# Patient Record
Sex: Female | Born: 1945
Health system: Southern US, Community
[De-identification: ages and names within clinical notes are randomized; demographics above are authoritative.]

## PROBLEM LIST (undated history)

## (undated) DIAGNOSIS — N6009 Solitary cyst of unspecified breast: Secondary | ICD-10-CM

## (undated) DIAGNOSIS — C50219 Malignant neoplasm of upper-inner quadrant of unspecified female breast: Secondary | ICD-10-CM

## (undated) DIAGNOSIS — T7840XA Allergy, unspecified, initial encounter: Secondary | ICD-10-CM

## (undated) DIAGNOSIS — E669 Obesity, unspecified: Secondary | ICD-10-CM

## (undated) DIAGNOSIS — Z1211 Encounter for screening for malignant neoplasm of colon: Secondary | ICD-10-CM

## (undated) DIAGNOSIS — C801 Malignant (primary) neoplasm, unspecified: Secondary | ICD-10-CM

## (undated) HISTORY — DX: Obesity, unspecified: E66.9

## (undated) HISTORY — DX: Encounter for screening for malignant neoplasm of colon: Z12.11

## (undated) HISTORY — DX: Malignant neoplasm of upper-inner quadrant of unspecified female breast: C50.219

## (undated) HISTORY — PX: APPENDECTOMY: SHX54

## (undated) HISTORY — PX: TONSILLECTOMY: SUR1361

## (undated) HISTORY — PX: TUBAL LIGATION: SHX77

## (undated) HISTORY — DX: Allergy, unspecified, initial encounter: T78.40XA

## (undated) HISTORY — DX: Malignant (primary) neoplasm, unspecified: C80.1

## (undated) HISTORY — DX: Solitary cyst of unspecified breast: N60.09

---

## 1989-01-05 HISTORY — PX: NASAL SINUS SURGERY: SHX719

## 1994-01-05 HISTORY — PX: FOOT SURGERY: SHX648

## 1997-04-02 ENCOUNTER — Other Ambulatory Visit: Admission: RE | Admit: 1997-04-02 | Discharge: 1997-04-02 | Payer: Self-pay | Admitting: Obstetrics & Gynecology

## 1998-05-20 ENCOUNTER — Other Ambulatory Visit: Admission: RE | Admit: 1998-05-20 | Discharge: 1998-05-20 | Payer: Self-pay | Admitting: Obstetrics & Gynecology

## 1999-12-01 ENCOUNTER — Other Ambulatory Visit: Admission: RE | Admit: 1999-12-01 | Discharge: 1999-12-01 | Payer: Self-pay | Admitting: Obstetrics & Gynecology

## 1999-12-02 ENCOUNTER — Other Ambulatory Visit: Admission: RE | Admit: 1999-12-02 | Discharge: 1999-12-02 | Payer: Self-pay | Admitting: Obstetrics & Gynecology

## 2001-02-09 ENCOUNTER — Other Ambulatory Visit: Admission: RE | Admit: 2001-02-09 | Discharge: 2001-02-09 | Payer: Self-pay | Admitting: Obstetrics & Gynecology

## 2004-01-06 HISTORY — PX: BREAST CYST EXCISION: SHX579

## 2008-01-06 DIAGNOSIS — C801 Malignant (primary) neoplasm, unspecified: Secondary | ICD-10-CM

## 2008-01-06 DIAGNOSIS — C50219 Malignant neoplasm of upper-inner quadrant of unspecified female breast: Secondary | ICD-10-CM

## 2008-01-06 DIAGNOSIS — T7840XA Allergy, unspecified, initial encounter: Secondary | ICD-10-CM

## 2008-01-06 HISTORY — DX: Malignant neoplasm of upper-inner quadrant of unspecified female breast: C50.219

## 2008-01-06 HISTORY — PX: BREAST BIOPSY: SHX20

## 2008-01-06 HISTORY — PX: UPPER GI ENDOSCOPY: SHX6162

## 2008-01-06 HISTORY — DX: Malignant (primary) neoplasm, unspecified: C80.1

## 2008-01-06 HISTORY — PX: COLONOSCOPY: SHX174

## 2008-01-06 HISTORY — DX: Allergy, unspecified, initial encounter: T78.40XA

## 2008-01-06 HISTORY — PX: BREAST SURGERY: SHX581

## 2008-10-16 ENCOUNTER — Ambulatory Visit: Payer: Self-pay | Admitting: General Surgery

## 2008-10-23 ENCOUNTER — Encounter: Admission: RE | Admit: 2008-10-23 | Discharge: 2008-10-23 | Payer: Self-pay | Admitting: Surgery

## 2008-11-21 ENCOUNTER — Ambulatory Visit: Payer: Self-pay | Admitting: General Surgery

## 2008-11-22 ENCOUNTER — Inpatient Hospital Stay: Payer: Self-pay | Admitting: General Surgery

## 2009-09-20 ENCOUNTER — Ambulatory Visit: Payer: Self-pay | Admitting: Otolaryngology

## 2010-01-05 DIAGNOSIS — N6009 Solitary cyst of unspecified breast: Secondary | ICD-10-CM

## 2010-01-05 HISTORY — PX: KNEE ARTHROSCOPY: SUR90

## 2010-01-05 HISTORY — DX: Solitary cyst of unspecified breast: N60.09

## 2010-01-07 ENCOUNTER — Encounter
Admission: RE | Admit: 2010-01-07 | Discharge: 2010-01-07 | Payer: Self-pay | Source: Home / Self Care | Attending: Internal Medicine | Admitting: Internal Medicine

## 2010-04-14 ENCOUNTER — Ambulatory Visit: Payer: Self-pay

## 2010-05-28 ENCOUNTER — Ambulatory Visit: Payer: Self-pay | Admitting: General Practice

## 2010-08-17 ENCOUNTER — Ambulatory Visit: Payer: Self-pay

## 2010-09-26 ENCOUNTER — Ambulatory Visit: Payer: Self-pay | Admitting: General Practice

## 2011-01-06 DIAGNOSIS — Z1211 Encounter for screening for malignant neoplasm of colon: Secondary | ICD-10-CM

## 2011-01-06 DIAGNOSIS — E669 Obesity, unspecified: Secondary | ICD-10-CM

## 2011-01-06 HISTORY — PX: SKIN CANCER EXCISION: SHX779

## 2011-01-06 HISTORY — DX: Obesity, unspecified: E66.9

## 2011-01-06 HISTORY — PX: KNEE SURGERY: SHX244

## 2011-01-06 HISTORY — DX: Encounter for screening for malignant neoplasm of colon: Z12.11

## 2011-02-16 ENCOUNTER — Other Ambulatory Visit: Payer: Self-pay | Admitting: Orthopedic Surgery

## 2011-04-13 ENCOUNTER — Other Ambulatory Visit: Payer: Self-pay | Admitting: Pediatrics

## 2011-05-18 ENCOUNTER — Encounter (HOSPITAL_COMMUNITY): Admission: RE | Payer: Self-pay | Source: Ambulatory Visit

## 2011-05-18 ENCOUNTER — Ambulatory Visit (HOSPITAL_COMMUNITY)
Admission: RE | Admit: 2011-05-18 | Payer: BC Managed Care – PPO | Source: Ambulatory Visit | Admitting: Orthopedic Surgery

## 2011-05-18 SURGERY — ARTHROPLASTY, KNEE, TOTAL
Anesthesia: Choice | Site: Knee | Laterality: Right

## 2012-06-06 ENCOUNTER — Encounter: Payer: Self-pay | Admitting: *Deleted

## 2012-06-06 DIAGNOSIS — C801 Malignant (primary) neoplasm, unspecified: Secondary | ICD-10-CM | POA: Insufficient documentation

## 2012-06-06 DIAGNOSIS — C50219 Malignant neoplasm of upper-inner quadrant of unspecified female breast: Secondary | ICD-10-CM | POA: Insufficient documentation

## 2012-11-01 ENCOUNTER — Ambulatory Visit (INDEPENDENT_AMBULATORY_CARE_PROVIDER_SITE_OTHER): Payer: Medicare Other | Admitting: General Surgery

## 2012-11-01 ENCOUNTER — Encounter: Payer: Self-pay | Admitting: General Surgery

## 2012-11-01 VITALS — BP 130/74 | HR 76 | Resp 12 | Ht 70.0 in | Wt 215.0 lb

## 2012-11-01 DIAGNOSIS — Z853 Personal history of malignant neoplasm of breast: Secondary | ICD-10-CM

## 2012-11-01 NOTE — Patient Instructions (Addendum)
Patient to return as needed. 

## 2012-11-01 NOTE — Progress Notes (Signed)
Patient ID: Katherine Frye, female   DOB: October 06, 1945, 67 y.o.   MRN: [id number]  Chief Complaint  Patient presents with  . Follow-up    mammogram    HPI Katherine Frye is a 67 y.o. female who presents for a breast evaluation. The most recent mammogram was done on 10/08/12. Patient does perform regular self breast checks and gets regular mammograms done.  The patient had her recent left breast mammogram completed at [facility] under the care of Rennis Harding, MD; her medical oncologist. She reports that she was notified by the performing facility that the study was normal.  HPI  Past Medical History  Diagnosis Date  . Allergy 2010    medication and seasonal  . Special screening for malignant neoplasms, colon 2013  . Obesity, unspecified 2013  . Cancer 2010    right breast  . Malignant neoplasm of upper-inner quadrant of female breast 2010  . Solitary cyst of breast 2012    left    Past Surgical History  Procedure Laterality Date  . Colonoscopy  2010    Dr. Chales Abrahams, [place]  . Upper gi endoscopy  2010  . Foot surgery  1996  . Tonsillectomy    . Appendectomy    . Nasal sinus surgery  1991  . Skin cancer excision  2013    basel cell on forehead  . Knee surgery Right 2013    replacement  . Knee arthroscopy Right 2012    May and October  . Tubal ligation    . Breast surgery Right 2010    T2, N0, ER/PR positive, HER-2/neu negative, Oncotype recurrence score  of 10 ( 7% risk of recurrence)   . Breast cyst excision Right 2006    fibroid cyst  . Breast biopsy Right 2010    Family History  Problem Relation Age of Onset  . Lymphoma Sister     non-hodgkin  . Cancer Sister     cancer of thyroid  . Cancer Other     family history of colon breast and ovarian cancer, no relationships listed    Social History History  Substance Use Topics  . Smoking status: Never Smoker   . Smokeless tobacco: Never Used  . Alcohol Use: No    Allergies  Allergen Reactions  .  Levaquin [Levofloxacin In D5w] Nausea And Vomiting    Also dizziness and headaches    Current Outpatient Prescriptions  Medication Sig Dispense Refill  . CALCIUM-MAGNESIUM-VITAMIN D PO Take 1 tablet by mouth daily.      . cetirizine (ZYRTEC) 10 MG tablet Take 10 mg by mouth daily.      . Cyanocobalamin (VITAMIN B-12) 5000 MCG LOZG Take 1 tablet by mouth daily.      . ergocalciferol (VITAMIN D2) 50000 UNITS capsule Take 50,000 Units by mouth once a week.      . letrozole (FEMARA) 2.5 MG tablet Take 1 tablet by mouth daily.      . Multiple Vitamins-Minerals (EYE VITAMINS PO) Take 1 tablet by mouth daily.      Marland Kitchen venlafaxine (EFFEXOR) 37.5 MG tablet Take 37.5 mg by mouth 2 (two) times daily.       No current facility-administered medications for this visit.    Review of Systems Review of Systems  Constitutional: Negative.   Respiratory: Negative.   Cardiovascular: Negative.     Blood pressure 130/74, pulse 76, resp. rate 12, height 5\' 10"  (1.778 m), weight 215 lb (97.523 kg).  Physical Exam Physical Exam  Constitutional: She is oriented to person, place, and time. She appears well-developed and well-nourished.  Neck: Neck supple.  Cardiovascular: Normal rate, regular rhythm and normal heart sounds.   Pulmonary/Chest: Breath sounds normal. Left breast exhibits no inverted nipple, no mass, no nipple discharge, no skin change and no tenderness.  Right mastectomy site well healed. Little fold in the the incision laterally that shows some focal irritation of the epidermis without ulceration. No evidence of chest wall, axillary or supraclavicular recurrence.  Lymphadenopathy:    She has no cervical adenopathy.    She has no axillary adenopathy.  Neurological: She is alert and oriented to person, place, and time.  Skin: Skin is warm and dry.    Data Reviewed Recent mammograms were reviewed although the report was not available. No areas of architectural distortion or suspicious  microcalcifications were identified.  Assessment    Doing well now 4 years out from treatment of her right breast cancer.     Plan    The patient will continue annual mammograms with her medical oncologist. She was encouraged to call should she have any concerns, follow up otherwise will be on an as needed basis.        Earline Mayotte 11/01/2012, 7:45 PM

## 2012-11-10 ENCOUNTER — Encounter: Payer: Self-pay | Admitting: General Surgery

## 2013-11-06 ENCOUNTER — Encounter: Payer: Self-pay | Admitting: General Surgery

## 2014-02-02 DIAGNOSIS — N644 Mastodynia: Secondary | ICD-10-CM | POA: Diagnosis not present

## 2014-02-02 DIAGNOSIS — C50411 Malignant neoplasm of upper-outer quadrant of right female breast: Secondary | ICD-10-CM | POA: Diagnosis not present

## 2014-02-28 DIAGNOSIS — R5383 Other fatigue: Secondary | ICD-10-CM | POA: Diagnosis not present

## 2014-02-28 DIAGNOSIS — Z23 Encounter for immunization: Secondary | ICD-10-CM | POA: Diagnosis not present

## 2014-02-28 DIAGNOSIS — K760 Fatty (change of) liver, not elsewhere classified: Secondary | ICD-10-CM | POA: Diagnosis not present

## 2014-02-28 DIAGNOSIS — E785 Hyperlipidemia, unspecified: Secondary | ICD-10-CM | POA: Diagnosis not present

## 2014-02-28 DIAGNOSIS — E559 Vitamin D deficiency, unspecified: Secondary | ICD-10-CM | POA: Diagnosis not present

## 2014-02-28 DIAGNOSIS — Z1389 Encounter for screening for other disorder: Secondary | ICD-10-CM | POA: Diagnosis not present

## 2014-02-28 DIAGNOSIS — E538 Deficiency of other specified B group vitamins: Secondary | ICD-10-CM | POA: Diagnosis not present

## 2014-02-28 DIAGNOSIS — Z9181 History of falling: Secondary | ICD-10-CM | POA: Diagnosis not present

## 2014-02-28 DIAGNOSIS — C50911 Malignant neoplasm of unspecified site of right female breast: Secondary | ICD-10-CM | POA: Diagnosis not present

## 2014-02-28 DIAGNOSIS — Z0000 Encounter for general adult medical examination without abnormal findings: Secondary | ICD-10-CM | POA: Diagnosis not present

## 2014-03-08 DIAGNOSIS — Z6833 Body mass index (BMI) 33.0-33.9, adult: Secondary | ICD-10-CM | POA: Diagnosis not present

## 2014-03-08 DIAGNOSIS — L270 Generalized skin eruption due to drugs and medicaments taken internally: Secondary | ICD-10-CM | POA: Diagnosis not present

## 2014-03-08 DIAGNOSIS — J309 Allergic rhinitis, unspecified: Secondary | ICD-10-CM | POA: Diagnosis not present

## 2014-03-08 DIAGNOSIS — R05 Cough: Secondary | ICD-10-CM | POA: Diagnosis not present

## 2014-05-09 DIAGNOSIS — F411 Generalized anxiety disorder: Secondary | ICD-10-CM | POA: Diagnosis not present

## 2014-05-09 DIAGNOSIS — J302 Other seasonal allergic rhinitis: Secondary | ICD-10-CM | POA: Diagnosis not present

## 2014-05-09 DIAGNOSIS — E559 Vitamin D deficiency, unspecified: Secondary | ICD-10-CM | POA: Diagnosis not present

## 2014-05-09 DIAGNOSIS — Z853 Personal history of malignant neoplasm of breast: Secondary | ICD-10-CM | POA: Diagnosis not present

## 2014-05-22 DIAGNOSIS — G252 Other specified forms of tremor: Secondary | ICD-10-CM | POA: Diagnosis not present

## 2014-05-22 DIAGNOSIS — Z6833 Body mass index (BMI) 33.0-33.9, adult: Secondary | ICD-10-CM | POA: Diagnosis not present

## 2014-05-22 DIAGNOSIS — Z9181 History of falling: Secondary | ICD-10-CM | POA: Diagnosis not present

## 2014-06-05 ENCOUNTER — Encounter: Payer: Self-pay | Admitting: Neurology

## 2014-06-05 ENCOUNTER — Ambulatory Visit (INDEPENDENT_AMBULATORY_CARE_PROVIDER_SITE_OTHER): Payer: Commercial Managed Care - HMO | Admitting: Neurology

## 2014-06-05 VITALS — BP 134/82 | HR 56 | Temp 97.6°F | Ht 70.0 in | Wt 221.0 lb

## 2014-06-05 DIAGNOSIS — R413 Other amnesia: Secondary | ICD-10-CM | POA: Insufficient documentation

## 2014-06-05 DIAGNOSIS — R251 Tremor, unspecified: Secondary | ICD-10-CM

## 2014-06-05 MED ORDER — GABAPENTIN 100 MG PO CAPS
100.0000 mg | ORAL_CAPSULE | Freq: Two times a day (BID) | ORAL | Status: DC
Start: 2014-06-05 — End: 2015-03-15

## 2014-06-05 NOTE — Patient Instructions (Signed)
Overall you are doing fairly well but I do want to suggest a few things today:   Remember to drink plenty of fluid, eat healthy meals and do not skip any meals. Try to eat protein with a every meal and eat a healthy snack such as fruit or nuts in between meals. Try to keep a regular sleep-wake schedule and try to exercise daily, particularly in the form of walking, 20-30 minutes a day, if you can.   As far as your medications are concerned, I would like to suggest: Neurontin 100mg  twice daily. This is a low dose, can increase as needed  As far as diagnostic testing: MRi of the brain  I would like to see you back in 3-6 months, sooner if we need to. Please call us with any interim questions, concerns, problems, updates or refill requests.   Please also call us for any test results so we can go over those with you on the phone.  My clinical assistant and will answer any of your questions and relay your messages to me and also relay most of my messages to you.   Our phone number is [phone]. We also have an after hours call service for urgent matters and there is a physician on-call for urgent questions. For any emergencies you know to call 911 or go to the nearest emergency room

## 2014-06-05 NOTE — Progress Notes (Addendum)
[facility]    Provider:  Dr Katherine Frye Referring Provider: [name], MD Primary Care Physician:  Katherine Dress, MD  CC:  Memory loss and tremor  HPI:  Katherine Frye is a 69 y.o. female here as a referral from Dr. Delena Frye for memory loss and tremor. She has a past medical history of hyperlipidemia, hypertension, irritable bowel syndrome, osteopenia, vitamin D deficiency, nonalcoholic fatty liver, chronic insomnia, breast cancer in 2010 status post mastectomy, vitamin B12 deficiency, kidney stones, malignant tumor of the. Knee him, metabolic syndrome. Patient is forgetting things, forgets what she is saying in the middle of a sentence, she reports slurred speech. She can't focus, she forgets simple words, she gets stumped. She forgets appointments. Daughter is here and provides much information. Patient lives independently with husband, pays own bills, cooks, cleans, drives, completely independent. No accidents in the home, not getting lost when driving. Memory symptoms have been going on for 3 years, worsening in the last 6 months. She had breast cancer, her sister died of ALS in 2022/01/16. She has been on Effexor for 5 years. No depression, denies mood issues. Reducing the venlafaxine helped with the tremor. Tremor in the right hand is worse when she picks up a glass for example. When she eats, it is difficult as well. No tremor in the chin or lower extremities. Worse tremor with fine motor tasks. No tremor in the family. Mother with dementia which started in her eighties. Denies neuropathy, did not get chemotherapy or radiation with her breast cancer. No shuffling, no stiffness, no symptoms of parkinsonism. She denies symptoms of obstructive sleep apnea, which can cause memory like problems, and had a sleep study within the last year approximately that was unremarkable. No family history of Parkinson's disease.  Reviewed notes, labs and imaging from outside physicians,  which showed: Patient has a history of breast cancer which she underwent right mastectomy in November 2010 with no recurrence and is closely followed by Dr. Bobby Frye. Most recent mammogram normal and gets regular mammograms done. She is on Femora. Her pulse is in the 50s which is common for her. Blood pressure is normal. She takes B12 supplements, with a history of B12 deficiency.  Review of Systems: Patient complains of symptoms per HPI as well as the following symptoms: Weight gain, fatigue, snoring, allergies, memory loss, slurred speech, too much sleep, decreased energy, insomnia, restless legs. Pertinent negatives per HPI. All others negative.   History   Social History  . Marital Status: Married    Spouse Name: Katherine Frye "Vic"  . Number of Children: 2  . Years of Education: 12   Occupational History  . Not on file.   Social History Main Topics  . Smoking status: Never Smoker   . Smokeless tobacco: Never Used  . Alcohol Use: No  . Drug Use: No  . Sexual Activity: Not on file   Other Topics Concern  . Not on file   Social History Narrative   Lives at home with husband.   Caffeine use: Drinks 1 cup coffee per day (sometimes 2 cups per day)       Family History  Problem Relation Age of Onset  . Lymphoma Sister     non-hodgkin  . Cancer Sister     cancer of thyroid  . Cancer Other     family history of colon breast and ovarian cancer, no relationships listed    Past Medical History  Diagnosis Date  . Allergy 2010  medication and seasonal  . Special screening for malignant neoplasms, colon 2013  . Obesity, unspecified 2013  . Cancer 2010    right breast  . Malignant neoplasm of upper-inner quadrant of female breast 2010  . Solitary cyst of breast 2012    left    Past Surgical History  Procedure Laterality Date  . Colonoscopy  2010    Dr. Lyndel Safe, [place]  . Upper gi endoscopy  2010  . Foot surgery  1996  . Tonsillectomy    . Appendectomy    . Nasal  sinus surgery  1991  . Skin cancer excision  2013    basel cell on forehead  . Knee surgery Right 2013    replacement  . Knee arthroscopy Right 2012    May and October  . Tubal ligation    . Breast surgery Right 2010    T2, N0, ER/PR positive, HER-2/neu negative, Oncotype recurrence score  of 10 ( 7% risk of recurrence)   . Breast cyst excision Right 2006    fibroid cyst  . Breast biopsy Right 2010    Current Outpatient Prescriptions  Medication Sig Dispense Refill  . Calcium Carbonate-Vitamin D (CALCIUM-VITAMIN D3 PO) Take 1,200 mg by mouth daily.    . cetirizine (ZYRTEC) 10 MG tablet Take 10 mg by mouth daily.    . ergocalciferol (VITAMIN D2) 50000 UNITS capsule Take 50,000 Units by mouth once a week.    . letrozole (FEMARA) 2.5 MG tablet Take 1 tablet by mouth daily.    . Multiple Vitamins-Minerals (CENTRUM SILVER PO) Take 1 tablet by mouth daily.    . Multiple Vitamins-Minerals (EYE VITAMINS PO) Take 1 tablet by mouth daily.    Marland Kitchen venlafaxine (EFFEXOR) 37.5 MG tablet Take 37.5 mg by mouth daily.     Marland Kitchen amoxicillin (AMOXIL) 500 MG capsule Take 500 mg by mouth 3 (three) times daily. Take 4 capsules by mouth 1 hour prior to dental appt.    . gabapentin (NEURONTIN) 100 MG capsule Take 1 capsule (100 mg total) by mouth 2 (two) times daily. 60 capsule 6  . meloxicam (MOBIC) 15 MG tablet Take 15 mg by mouth daily.     No current facility-administered medications for this visit.    Allergies as of 06/05/2014 - Review Complete 06/05/2014  Allergen Reaction Noted  . Levaquin [levofloxacin in d5w] Nausea And Vomiting 06/06/2012    Vitals: BP 134/82 mmHg  Pulse 56  Temp(Src) 97.6 F (36.4 C)  Ht _0  (1.778 m)  Wt 221 lb (100.245 kg)  BMI 31.71 kg/m2 Last Weight:  Wt Readings from Last 1 Encounters:  06/05/14 221 lb (100.245 kg)   Last Height:   Ht Readings from Last 1 Encounters:  06/05/14 _1  (1.778 m)   Physical exam: Exam: Gen: NAD, conversant, well nourised,  obese, well groomed                     CV: RRR, no MRG. No Carotid Bruits. No peripheral edema, warm, nontender Eyes: Conjunctivae clear without exudates or hemorrhage  Neuro: Detailed Neurologic Exam  Speech:    Speech is normal; fluent and spontaneous with normal comprehension.  Cognition: MoCA 25/30 (-2 visual-spatial, -1 naming, -1 delayed recall, minus one serial 7)    The patient is oriented to person, place, and time;     recent and remote memory intact;     language fluent;     normal attention, concentration,     fund of  knowledge Cranial Nerves:    The pupils are equal, round, and reactive to light. The fundi are flat. Visual fields are full to finger confrontation. Extraocular movements are intact. Trigeminal sensation is intact and the muscles of mastication are normal. The face is symmetric. The palate elevates in the midline. Hearing intact. Voice is normal. Shoulder shrug is normal. The tongue has normal motion without fasciculations.   Coordination:    Normal finger to nose and heel to shin. Normal rapid alternating movements.   Gait:    No ataxia, no shuffling, good arm swing  Motor Observation:    No asymmetry, no atrophy, and no involuntary movements noted. Tone:    Normal muscle tone.    Posture:    Posture is normal. normal erect    Strength:    Strength is V/V in the upper and lower limbs.      Sensation: intact to LT     Reflex Exam:  DTR's:    Deep tendon reflexes in the upper and lower extremities are normal bilaterally.   Toes:    The toes are downgoing bilaterally.   Clonus:    Clonus is absent.       Assessment/Plan:  69 year old female here for evaluation of mild memory changes and tremor. Tremor has improved with decrease of venlafaxine, but still affects her eating and writing and fine motor tasks. No signs of parkinsonism on exam. Exam is nonfocal.  Tremor may be due to medication vs essential tremor. She just decreased her  venlafaxine which helped. We'll start Neurontin at very low dose for tremor. Discussed possible side effects including somnolence, fatigue, swelling, weight gain. Patient is to stop for anything concerning. Started at very low doses patient says she is very sensitive to medication, can increase as tolerated or needed.  Will get TSH and B12 results from Dr. Delena Frye. Discussed that B12 deficiency can cause memory issues.  Patient denies depression or mood symptoms, moca is mildly decreased at 25/30. May be cognitive changes normal in aging. Will order an MRI of the brain to rule out other intracranial possibilities such as stroke or vascular causes. Discussed formal neurocognitive testing  Her fatigue severity scale was 35 and her Epworth Sleepiness Scale is 2. Not really concerning for obstructive sleep apnea and previous unremarkable sleep study.  Addendum: B12 658 and TSH within normal limits collected February 2016.      Sarina Ill, MD  [facility] [address] [place],  [address]  Phone [phone] Fax [phone]

## 2014-06-06 ENCOUNTER — Telehealth: Payer: Self-pay | Admitting: Neurology

## 2014-06-06 ENCOUNTER — Telehealth: Payer: Self-pay | Admitting: *Deleted

## 2014-06-06 NOTE — Telephone Encounter (Signed)
Patient called stating she took 1 dose of gabapentin last night. She states she fell out of bed while she was asleep resulting in a bruise on forehead above right eye, bruise on left arm between shoulder and elbow and cut left ear.  She took 1 dose of medication this morning and is just now feeling like herself. She is requesting to discontinue this medication. Please call and advise. Patient can be reached at [phone].

## 2014-06-06 NOTE — Telephone Encounter (Signed)
Spoke with pt and she stated she fell out of bed last night in the middle of sleeping. She did take gabapentin last night, 100mg . She stated she had a "slight headache" after falling but just a sore pain, not a headache pain. She stated "It felt like someone else was falling and it was not me". I asked if someone was there when it happened, but she stated "my husband was sleeping and I was calling for him but he had his hearing aids in and could not hear me". I told her to not take any more of the medication until I spoke with Dr. [name] and advised her to go to the ED  to make sure there was no trauma to her brain. She stated, "But I feel much better, and the bump went out and not in". I still recommended she go to the ED and we would call her back about the medication. Pt verbalized understanding.

## 2014-06-06 NOTE — Telephone Encounter (Signed)
Spoke with Katherine Frye from Dr. Dorothea Glassman office and she stated patients last B-12 and TSH levels were drawn in February. She is going to fax over results to [place] to fax number [phone] with attn. To Terrence Dupont.

## 2014-06-06 NOTE — Telephone Encounter (Signed)
Called patient, she was not home. Left message if she has an alteration of mentation or headache that she should go to the hospital. Will call her again tomorrow. thanks

## 2014-06-07 NOTE — Telephone Encounter (Signed)
Left 2 messages. Asked patient to call back if she still wished to speak. Hold neurontin.

## 2014-06-12 DIAGNOSIS — R413 Other amnesia: Secondary | ICD-10-CM

## 2014-06-12 DIAGNOSIS — R251 Tremor, unspecified: Secondary | ICD-10-CM

## 2014-06-13 ENCOUNTER — Other Ambulatory Visit: Payer: Self-pay | Admitting: Diagnostic Neuroimaging

## 2014-06-13 DIAGNOSIS — R413 Other amnesia: Secondary | ICD-10-CM

## 2014-06-13 DIAGNOSIS — R251 Tremor, unspecified: Secondary | ICD-10-CM

## 2014-06-14 ENCOUNTER — Telehealth: Payer: Self-pay | Admitting: *Deleted

## 2014-06-14 NOTE — Telephone Encounter (Signed)
Spoke with pt about MRI. Told her it was normal for her age. She would like Dr. [name] to call [place], her daughter, to discuss results in more today either today or tomorrow because her daughter is leaving for the weekend. I told her I would let Dr. [name] know. She verbalized understanding.

## 2014-06-17 NOTE — Telephone Encounter (Signed)
Left detailed message on her voicemail. She can call back if she still would like to discuss. Thank you.   IMPRESSION:  Abnormal MRI brain (without) demonstrating: 1. Mild perisylvian atrophy. 2. Few scattered periventricular and subcortical foci of non-specific gliosis. 3. No acute findings.

## 2014-07-17 DIAGNOSIS — M7551 Bursitis of right shoulder: Secondary | ICD-10-CM | POA: Diagnosis not present

## 2014-07-17 DIAGNOSIS — M25511 Pain in right shoulder: Secondary | ICD-10-CM | POA: Diagnosis not present

## 2014-08-13 ENCOUNTER — Ambulatory Visit: Payer: Commercial Managed Care - HMO | Admitting: Neurology

## 2014-08-23 DIAGNOSIS — I872 Venous insufficiency (chronic) (peripheral): Secondary | ICD-10-CM | POA: Diagnosis not present

## 2014-08-23 DIAGNOSIS — I83813 Varicose veins of bilateral lower extremities with pain: Secondary | ICD-10-CM | POA: Diagnosis not present

## 2014-08-23 DIAGNOSIS — Z23 Encounter for immunization: Secondary | ICD-10-CM | POA: Diagnosis not present

## 2014-08-23 DIAGNOSIS — Z6833 Body mass index (BMI) 33.0-33.9, adult: Secondary | ICD-10-CM | POA: Diagnosis not present

## 2014-09-06 DIAGNOSIS — R109 Unspecified abdominal pain: Secondary | ICD-10-CM | POA: Diagnosis not present

## 2014-09-06 DIAGNOSIS — R110 Nausea: Secondary | ICD-10-CM | POA: Diagnosis not present

## 2014-09-14 DIAGNOSIS — M545 Low back pain: Secondary | ICD-10-CM | POA: Diagnosis not present

## 2014-09-14 DIAGNOSIS — M47816 Spondylosis without myelopathy or radiculopathy, lumbar region: Secondary | ICD-10-CM | POA: Diagnosis not present

## 2014-09-14 DIAGNOSIS — K589 Irritable bowel syndrome without diarrhea: Secondary | ICD-10-CM | POA: Diagnosis not present

## 2014-09-14 DIAGNOSIS — Z6832 Body mass index (BMI) 32.0-32.9, adult: Secondary | ICD-10-CM | POA: Diagnosis not present

## 2014-10-26 DIAGNOSIS — Z1231 Encounter for screening mammogram for malignant neoplasm of breast: Secondary | ICD-10-CM | POA: Diagnosis not present

## 2014-10-31 DIAGNOSIS — Z6833 Body mass index (BMI) 33.0-33.9, adult: Secondary | ICD-10-CM | POA: Diagnosis not present

## 2014-10-31 DIAGNOSIS — M159 Polyosteoarthritis, unspecified: Secondary | ICD-10-CM | POA: Diagnosis not present

## 2014-10-31 DIAGNOSIS — Z23 Encounter for immunization: Secondary | ICD-10-CM | POA: Diagnosis not present

## 2014-11-06 DIAGNOSIS — H353121 Nonexudative age-related macular degeneration, left eye, early dry stage: Secondary | ICD-10-CM | POA: Diagnosis not present

## 2014-11-06 DIAGNOSIS — R928 Other abnormal and inconclusive findings on diagnostic imaging of breast: Secondary | ICD-10-CM | POA: Diagnosis not present

## 2014-11-06 DIAGNOSIS — H5210 Myopia, unspecified eye: Secondary | ICD-10-CM | POA: Diagnosis not present

## 2014-11-06 DIAGNOSIS — H524 Presbyopia: Secondary | ICD-10-CM | POA: Diagnosis not present

## 2014-11-23 DIAGNOSIS — Z79811 Long term (current) use of aromatase inhibitors: Secondary | ICD-10-CM | POA: Diagnosis not present

## 2014-11-23 DIAGNOSIS — Z853 Personal history of malignant neoplasm of breast: Secondary | ICD-10-CM | POA: Diagnosis not present

## 2014-12-17 DIAGNOSIS — M542 Cervicalgia: Secondary | ICD-10-CM | POA: Diagnosis not present

## 2014-12-17 DIAGNOSIS — M5032 Other cervical disc degeneration, mid-cervical region, unspecified level: Secondary | ICD-10-CM | POA: Diagnosis not present

## 2014-12-17 DIAGNOSIS — M479 Spondylosis, unspecified: Secondary | ICD-10-CM | POA: Diagnosis not present

## 2014-12-17 DIAGNOSIS — M4804 Spinal stenosis, thoracic region: Secondary | ICD-10-CM | POA: Diagnosis not present

## 2014-12-17 DIAGNOSIS — M50322 Other cervical disc degeneration at C5-C6 level: Secondary | ICD-10-CM | POA: Diagnosis not present

## 2014-12-17 DIAGNOSIS — K2900 Acute gastritis without bleeding: Secondary | ICD-10-CM | POA: Diagnosis not present

## 2014-12-17 DIAGNOSIS — M546 Pain in thoracic spine: Secondary | ICD-10-CM | POA: Diagnosis not present

## 2014-12-17 DIAGNOSIS — Z6833 Body mass index (BMI) 33.0-33.9, adult: Secondary | ICD-10-CM | POA: Diagnosis not present

## 2014-12-17 DIAGNOSIS — M47814 Spondylosis without myelopathy or radiculopathy, thoracic region: Secondary | ICD-10-CM | POA: Diagnosis not present

## 2014-12-25 DIAGNOSIS — M5033 Other cervical disc degeneration, cervicothoracic region: Secondary | ICD-10-CM | POA: Diagnosis not present

## 2014-12-25 DIAGNOSIS — M6281 Muscle weakness (generalized): Secondary | ICD-10-CM | POA: Diagnosis not present

## 2014-12-27 DIAGNOSIS — M6281 Muscle weakness (generalized): Secondary | ICD-10-CM | POA: Diagnosis not present

## 2014-12-27 DIAGNOSIS — M5033 Other cervical disc degeneration, cervicothoracic region: Secondary | ICD-10-CM | POA: Diagnosis not present

## 2015-01-03 DIAGNOSIS — M6281 Muscle weakness (generalized): Secondary | ICD-10-CM | POA: Diagnosis not present

## 2015-01-03 DIAGNOSIS — M5033 Other cervical disc degeneration, cervicothoracic region: Secondary | ICD-10-CM | POA: Diagnosis not present

## 2015-01-04 DIAGNOSIS — Z6833 Body mass index (BMI) 33.0-33.9, adult: Secondary | ICD-10-CM | POA: Diagnosis not present

## 2015-01-04 DIAGNOSIS — H8113 Benign paroxysmal vertigo, bilateral: Secondary | ICD-10-CM | POA: Diagnosis not present

## 2015-01-08 DIAGNOSIS — M6281 Muscle weakness (generalized): Secondary | ICD-10-CM | POA: Diagnosis not present

## 2015-01-08 DIAGNOSIS — M5033 Other cervical disc degeneration, cervicothoracic region: Secondary | ICD-10-CM | POA: Diagnosis not present

## 2015-01-10 DIAGNOSIS — M6281 Muscle weakness (generalized): Secondary | ICD-10-CM | POA: Diagnosis not present

## 2015-01-10 DIAGNOSIS — M5033 Other cervical disc degeneration, cervicothoracic region: Secondary | ICD-10-CM | POA: Diagnosis not present

## 2015-01-17 DIAGNOSIS — M6281 Muscle weakness (generalized): Secondary | ICD-10-CM | POA: Diagnosis not present

## 2015-01-17 DIAGNOSIS — M5033 Other cervical disc degeneration, cervicothoracic region: Secondary | ICD-10-CM | POA: Diagnosis not present

## 2015-01-22 DIAGNOSIS — M5033 Other cervical disc degeneration, cervicothoracic region: Secondary | ICD-10-CM | POA: Diagnosis not present

## 2015-01-22 DIAGNOSIS — M6281 Muscle weakness (generalized): Secondary | ICD-10-CM | POA: Diagnosis not present

## 2015-01-24 DIAGNOSIS — M5033 Other cervical disc degeneration, cervicothoracic region: Secondary | ICD-10-CM | POA: Diagnosis not present

## 2015-01-24 DIAGNOSIS — M6281 Muscle weakness (generalized): Secondary | ICD-10-CM | POA: Diagnosis not present

## 2015-01-29 DIAGNOSIS — M5033 Other cervical disc degeneration, cervicothoracic region: Secondary | ICD-10-CM | POA: Diagnosis not present

## 2015-01-29 DIAGNOSIS — M6281 Muscle weakness (generalized): Secondary | ICD-10-CM | POA: Diagnosis not present

## 2015-01-30 DIAGNOSIS — M47812 Spondylosis without myelopathy or radiculopathy, cervical region: Secondary | ICD-10-CM | POA: Diagnosis not present

## 2015-02-05 DIAGNOSIS — M6281 Muscle weakness (generalized): Secondary | ICD-10-CM | POA: Diagnosis not present

## 2015-02-05 DIAGNOSIS — M5033 Other cervical disc degeneration, cervicothoracic region: Secondary | ICD-10-CM | POA: Diagnosis not present

## 2015-02-07 DIAGNOSIS — M5033 Other cervical disc degeneration, cervicothoracic region: Secondary | ICD-10-CM | POA: Diagnosis not present

## 2015-02-07 DIAGNOSIS — M6281 Muscle weakness (generalized): Secondary | ICD-10-CM | POA: Diagnosis not present

## 2015-02-11 DIAGNOSIS — M6281 Muscle weakness (generalized): Secondary | ICD-10-CM | POA: Diagnosis not present

## 2015-02-11 DIAGNOSIS — M5033 Other cervical disc degeneration, cervicothoracic region: Secondary | ICD-10-CM | POA: Diagnosis not present

## 2015-02-14 DIAGNOSIS — M5033 Other cervical disc degeneration, cervicothoracic region: Secondary | ICD-10-CM | POA: Diagnosis not present

## 2015-02-14 DIAGNOSIS — M6281 Muscle weakness (generalized): Secondary | ICD-10-CM | POA: Diagnosis not present

## 2015-02-14 DIAGNOSIS — D519 Vitamin B12 deficiency anemia, unspecified: Secondary | ICD-10-CM | POA: Diagnosis not present

## 2015-02-14 DIAGNOSIS — R42 Dizziness and giddiness: Secondary | ICD-10-CM | POA: Diagnosis not present

## 2015-02-14 DIAGNOSIS — E559 Vitamin D deficiency, unspecified: Secondary | ICD-10-CM | POA: Diagnosis not present

## 2015-02-14 DIAGNOSIS — H538 Other visual disturbances: Secondary | ICD-10-CM | POA: Diagnosis not present

## 2015-02-14 DIAGNOSIS — G450 Vertebro-basilar artery syndrome: Secondary | ICD-10-CM | POA: Diagnosis not present

## 2015-02-14 DIAGNOSIS — Z6833 Body mass index (BMI) 33.0-33.9, adult: Secondary | ICD-10-CM | POA: Diagnosis not present

## 2015-02-14 DIAGNOSIS — M159 Polyosteoarthritis, unspecified: Secondary | ICD-10-CM | POA: Diagnosis not present

## 2015-02-18 ENCOUNTER — Ambulatory Visit (INDEPENDENT_AMBULATORY_CARE_PROVIDER_SITE_OTHER): Payer: Commercial Managed Care - HMO | Admitting: Podiatry

## 2015-02-18 ENCOUNTER — Encounter: Payer: Self-pay | Admitting: Podiatry

## 2015-02-18 DIAGNOSIS — B351 Tinea unguium: Secondary | ICD-10-CM | POA: Diagnosis not present

## 2015-02-18 DIAGNOSIS — M79676 Pain in unspecified toe(s): Secondary | ICD-10-CM

## 2015-02-18 NOTE — Progress Notes (Signed)
   Subjective:    Patient ID: Katherine Frye, female    DOB: [birth year], 70 y.o.   MRN: [id number]  HPI i have some toenail fungus on my two big toes and they are thick and discolored This patient presents the office with chief complaint of 2 painful, thick big toenails both feet. She states she has been dealing with the nail 4 months and has been applying fungal medication to the nails. She states there has been minimal improvement at this time. She presents the office for an evaluation and treatment of this condition   Review of Systems  All other systems reviewed and are negative.      Objective:   Physical Exam GENERAL APPEARANCE: Alert, conversant. Appropriately groomed. No acute distress.  VASCULAR: Pedal pulses palpable at  [facility] and PT bilateral.  Capillary refill time is immediate to all digits,  Normal temperature gradient.  Digital hair growth is present bilateral  NEUROLOGIC: sensation is normal to 5.07 monofilament at 5/5 sites bilateral.  Light touch is intact bilateral, Muscle strength normal.  MUSCULOSKELETAL: acceptable muscle strength, tone and stability bilateral.  Intrinsic muscluature intact bilateral.  Rectus appearance of foot and digits noted bilateral.   DERMATOLOGIC: skin color, texture, and turgor are within normal limits.  No preulcerative lesions or ulcers  are seen, no interdigital maceration noted.  No open lesions present.  . No drainage noted NAILS  Thick disifgured discolored hallux toenails both feet.  No paronychia noted.         Assessment & Plan:  Onychomycosis hallux B/L  IE  Debridement of nails.  Discussed lamisil treatment with patient.  Gardiner Barefoot DPM

## 2015-02-19 DIAGNOSIS — R42 Dizziness and giddiness: Secondary | ICD-10-CM | POA: Diagnosis not present

## 2015-02-19 DIAGNOSIS — G451 Carotid artery syndrome (hemispheric): Secondary | ICD-10-CM | POA: Diagnosis not present

## 2015-02-19 DIAGNOSIS — M6281 Muscle weakness (generalized): Secondary | ICD-10-CM | POA: Diagnosis not present

## 2015-02-19 DIAGNOSIS — M5033 Other cervical disc degeneration, cervicothoracic region: Secondary | ICD-10-CM | POA: Diagnosis not present

## 2015-02-19 DIAGNOSIS — G450 Vertebro-basilar artery syndrome: Secondary | ICD-10-CM | POA: Diagnosis not present

## 2015-02-21 DIAGNOSIS — R42 Dizziness and giddiness: Secondary | ICD-10-CM | POA: Diagnosis not present

## 2015-02-21 DIAGNOSIS — Z6832 Body mass index (BMI) 32.0-32.9, adult: Secondary | ICD-10-CM | POA: Diagnosis not present

## 2015-02-22 DIAGNOSIS — I619 Nontraumatic intracerebral hemorrhage, unspecified: Secondary | ICD-10-CM | POA: Diagnosis not present

## 2015-02-22 DIAGNOSIS — H538 Other visual disturbances: Secondary | ICD-10-CM | POA: Diagnosis not present

## 2015-02-22 DIAGNOSIS — J3489 Other specified disorders of nose and nasal sinuses: Secondary | ICD-10-CM | POA: Diagnosis not present

## 2015-02-25 DIAGNOSIS — M9902 Segmental and somatic dysfunction of thoracic region: Secondary | ICD-10-CM | POA: Diagnosis not present

## 2015-02-25 DIAGNOSIS — M9901 Segmental and somatic dysfunction of cervical region: Secondary | ICD-10-CM | POA: Diagnosis not present

## 2015-02-25 DIAGNOSIS — M5413 Radiculopathy, cervicothoracic region: Secondary | ICD-10-CM | POA: Diagnosis not present

## 2015-02-25 DIAGNOSIS — M50323 Other cervical disc degeneration at C6-C7 level: Secondary | ICD-10-CM | POA: Diagnosis not present

## 2015-02-25 DIAGNOSIS — M546 Pain in thoracic spine: Secondary | ICD-10-CM | POA: Diagnosis not present

## 2015-03-02 DIAGNOSIS — Z6832 Body mass index (BMI) 32.0-32.9, adult: Secondary | ICD-10-CM | POA: Diagnosis not present

## 2015-03-02 DIAGNOSIS — M4696 Unspecified inflammatory spondylopathy, lumbar region: Secondary | ICD-10-CM | POA: Diagnosis not present

## 2015-03-02 DIAGNOSIS — E559 Vitamin D deficiency, unspecified: Secondary | ICD-10-CM | POA: Diagnosis not present

## 2015-03-02 DIAGNOSIS — H8113 Benign paroxysmal vertigo, bilateral: Secondary | ICD-10-CM | POA: Diagnosis not present

## 2015-03-02 DIAGNOSIS — F418 Other specified anxiety disorders: Secondary | ICD-10-CM | POA: Diagnosis not present

## 2015-03-04 DIAGNOSIS — J342 Deviated nasal septum: Secondary | ICD-10-CM | POA: Diagnosis not present

## 2015-03-04 DIAGNOSIS — H903 Sensorineural hearing loss, bilateral: Secondary | ICD-10-CM | POA: Diagnosis not present

## 2015-03-04 DIAGNOSIS — R42 Dizziness and giddiness: Secondary | ICD-10-CM | POA: Diagnosis not present

## 2015-03-04 DIAGNOSIS — H9319 Tinnitus, unspecified ear: Secondary | ICD-10-CM | POA: Diagnosis not present

## 2015-03-05 DIAGNOSIS — R262 Difficulty in walking, not elsewhere classified: Secondary | ICD-10-CM | POA: Diagnosis not present

## 2015-03-05 DIAGNOSIS — R42 Dizziness and giddiness: Secondary | ICD-10-CM | POA: Diagnosis not present

## 2015-03-11 DIAGNOSIS — R531 Weakness: Secondary | ICD-10-CM | POA: Diagnosis not present

## 2015-03-11 DIAGNOSIS — R42 Dizziness and giddiness: Secondary | ICD-10-CM | POA: Diagnosis not present

## 2015-03-11 DIAGNOSIS — Z6832 Body mass index (BMI) 32.0-32.9, adult: Secondary | ICD-10-CM | POA: Diagnosis not present

## 2015-03-13 ENCOUNTER — Telehealth: Payer: Self-pay | Admitting: Neurology

## 2015-03-13 DIAGNOSIS — G459 Transient cerebral ischemic attack, unspecified: Secondary | ICD-10-CM | POA: Diagnosis not present

## 2015-03-13 DIAGNOSIS — Z853 Personal history of malignant neoplasm of breast: Secondary | ICD-10-CM | POA: Diagnosis not present

## 2015-03-13 DIAGNOSIS — R55 Syncope and collapse: Secondary | ICD-10-CM | POA: Diagnosis not present

## 2015-03-13 DIAGNOSIS — R4182 Altered mental status, unspecified: Secondary | ICD-10-CM | POA: Diagnosis not present

## 2015-03-13 NOTE — Telephone Encounter (Signed)
Yes I need [facility] records and also the psych consult. When we get those then we can see patient for follow up in the office. please let them know thanks

## 2015-03-13 NOTE — Telephone Encounter (Signed)
Patient's daughter is calling back and stated if we need records of the patient's visit to [facility] all we need to do is send a request to the hospital. She said per the hospital the patient would not need to sign this request.

## 2015-03-13 NOTE — Telephone Encounter (Signed)
Dr [name]- [facility]. Would you like records?

## 2015-03-13 NOTE — Telephone Encounter (Signed)
Pt's has appt 3/10 - referral. Daughter called today and says they took pt to oncologist. Pt complains of dizziness and nausea and other times she can not sit still. Pt became very erratic while in the office. They believe she had a seizure while in the office. Family and pt is having a psych consult done due to pts behavior. They are not sure what is going on but wanted to make Dr. [name] aware of what is happening. Pt was seen at [facility] as well as [place]. May call  [phone] pts home, [name]

## 2015-03-14 ENCOUNTER — Telehealth: Payer: Self-pay | Admitting: *Deleted

## 2015-03-14 NOTE — Telephone Encounter (Signed)
Received records from [facility] on 03/14/15 notes on Katherine Frye.

## 2015-03-14 NOTE — Telephone Encounter (Signed)
Ok per Dr [name] to keep appt for tomorrow. Received records from [place] with Dr [name] notes.

## 2015-03-15 ENCOUNTER — Ambulatory Visit (INDEPENDENT_AMBULATORY_CARE_PROVIDER_SITE_OTHER): Payer: Commercial Managed Care - HMO | Admitting: Neurology

## 2015-03-15 ENCOUNTER — Encounter: Payer: Self-pay | Admitting: Neurology

## 2015-03-15 VITALS — BP 118/78 | HR 63 | Ht 70.0 in | Wt 208.6 lb

## 2015-03-15 DIAGNOSIS — R413 Other amnesia: Secondary | ICD-10-CM

## 2015-03-15 DIAGNOSIS — R251 Tremor, unspecified: Secondary | ICD-10-CM

## 2015-03-15 DIAGNOSIS — F411 Generalized anxiety disorder: Secondary | ICD-10-CM | POA: Diagnosis not present

## 2015-03-15 DIAGNOSIS — F329 Major depressive disorder, single episode, unspecified: Secondary | ICD-10-CM | POA: Diagnosis not present

## 2015-03-15 DIAGNOSIS — R404 Transient alteration of awareness: Secondary | ICD-10-CM

## 2015-03-15 DIAGNOSIS — G4700 Insomnia, unspecified: Secondary | ICD-10-CM | POA: Diagnosis not present

## 2015-03-15 DIAGNOSIS — R42 Dizziness and giddiness: Secondary | ICD-10-CM

## 2015-03-15 DIAGNOSIS — F32A Depression, unspecified: Secondary | ICD-10-CM

## 2015-03-15 DIAGNOSIS — G450 Vertebro-basilar artery syndrome: Secondary | ICD-10-CM

## 2015-03-15 NOTE — Progress Notes (Signed)
[name]    Provider:  Dr Katherine Frye Referring Provider: [name], MD Primary Care Physician:  Katherine Dress, MD  CC: Memory loss and tremor and now dizziness  Interval update 03/15/2015: 70 year old female here for follow up of multiple complaints.  She has a past medical history of significant psychiatric stressors, hyperlipidemia, hypertension, irritable bowel syndrome, osteopenia, vitamin D deficiency, nonalcoholic fatty liver, chronic insomnia, breast cancer in 2010 status post mastectomy, vitamin B12 deficiency. She had an episode last Wednesday. She was sitting at the table and eveything went ok but she ws getting really hot, she felt bad, she laid back and passed out, 2 minutes, she was agitated, she couldn't sit stil, she went "up and back", her left arm went limp, right arm went up and her head was shaking her head turned to the right, eyes fixated on the ceiling, she closed her eyes and became unresponsive while she laid back on the table. Doctor was shaking her and she was [place]. When she woke up her voice was different. She was not confused, she kept saying to her kids "don't ket me die". She knew exactly where she was. She did not bite her tongue, dId not urinate or defecate. This was in the setting of the doctor telling her something unpleasant.   She is having persistant dizziness. On February 17th she was having nausea and dizziness. MRI was negative.Neck and back hurting. She has arthritis in her neck and back. She has not been taking her medications as prescribed per daughter. She was having diarrhea and nausea that week. The following Saturday the 25th daughter went there and she was still having nausea and dizziness. She was plaved on valium, antivert and daughter started taking care of her meds for patient to help her with pill box, then later that week she started taking effexor which was a medication they had discontinued. There have been  lots of medication changes in the past few months. She still experiences tremor and multiple other complaints, significant insomnia, she was recently seen by psychiatry and is following up there.   MRI of the brain 06/2014: Feel this MRI is normal for age. 1. Mild perisylvian atrophy. 2. Few scattered periventricular and subcortical foci of non-specific gliosis. 3. No acute findings.  Reviewed notes from [facility] emergency Department. Patient was seen 03/13/2015. Episode of "passing out". Vital signs within normal limits. CMP and CBC were unremarkable with a creatinine of 0.8 on 03/13/2015. CT head without contrast showed no acute intracranial abnormality, mild cerebral atrophy, mild. Ventricular white matter decreased attenuation probably due to chronic small vessel ischemic changes. Urinalysis was negative for infection. Chest x-ray showed no active disease.  Reviewed notes from [facility] internal medicine. She had been seen there on a regular basis for ongoing problems with dizziness and vertigo. Per notes, MRI of the brain was normal and carotid ultrasound normal. CBC was normal. B12 level normal. They have tried her on meclizine, Phenergan and Transderm scopolamine patch but she still has episodes of vertigo and feels like the room is spinning, with nausea, no vomiting and an of the medications have worked. She also has arthritic pains and meloxicam was restarted. She has anxiety and depression. In the past she was on venlafaxine and they felt that she was depressed over her state and venlafaxine was restarted. She is not sleeping well with significant insomnia, has a very flat affect and looks depressed.  Initial visit 05/2014: Katherine Frye is  a 70 y.o. female here as a referral from Dr. Delena Frye for memory loss and tremor. She has a past medical history of hyperlipidemia, hypertension, irritable bowel syndrome, osteopenia, vitamin D deficiency, nonalcoholic fatty liver, chronic  insomnia, breast cancer in 2010 status post mastectomy, vitamin B12 deficiency, kidney stones, malignant tumor of the. Knee him, metabolic syndrome. Patient is forgetting things, forgets what she is saying in the middle of a sentence, she reports slurred speech. She can't focus, she forgets simple words, she gets stumped. She forgets appointments. Daughter is here and provides much information. Patient lives independently with husband, pays own bills, cooks, cleans, drives, completely independent. No accidents in the home, not getting lost when driving. Memory symptoms have been going on for 3 years, worsening in the last 6 months. She had breast cancer, her sister died of ALS in 02-17-2022. She has been on Effexor for 5 years. No depression, denies mood issues. Reducing the venlafaxine helped with the tremor. Tremor in the right hand is worse when she picks up a glass for example. When she eats, it is difficult as well. No tremor in the chin or lower extremities. Worse tremor with fine motor tasks. No tremor in the family. Mother with dementia which started in her eighties. Denies neuropathy, did not get chemotherapy or radiation with her breast cancer. No shuffling, no stiffness, no symptoms of parkinsonism. She denies symptoms of obstructive sleep apnea, which can cause memory like problems, and had a sleep study within the last year approximately that was unremarkable. No family history of Parkinson's disease.  Reviewed notes, labs and imaging from outside physicians, which showed: Patient has a history of breast cancer which she underwent right mastectomy in November 2010 with no recurrence and is closely followed by Dr. Bobby Frye. Most recent mammogram normal and gets regular mammograms done. She is on Femora. Her pulse is in the 50s which is common for her. Blood pressure is normal. She takes B12 supplements, with a history of B12 deficiency.  Review of Systems: Patient complains of symptoms per HPI as well as  the following symptoms: Activity change, appetite change, chills, fatigue, cold intolerance, heat intolerance, blurred vision, drooling, nausea, restless legs, insomnia, sleep talking, dizziness, speech difficulty, weakness, tremors, passing out, agitation, behavior problem, confusion, decreased concentration, depression, anxiety. Pertinent negatives per HPI. All others negative.   Social History   Social History  . Marital Status: Married    Spouse Name: Preslee Regas "Vic"  . Number of Children: 2  . Years of Education: 12   Occupational History  . Not on file.   Social History Main Topics  . Smoking status: Never Smoker   . Smokeless tobacco: Never Used  . Alcohol Use: No  . Drug Use: No  . Sexual Activity: Not on file   Other Topics Concern  . Not on file   Social History Narrative   Lives at home with husband.   Caffeine use: Drinks 1 cup coffee per day (sometimes 2 cups per day)       Family History  Problem Relation Age of Onset  . Lymphoma Sister     non-hodgkin  . Cancer Sister     cancer of thyroid  . Cancer Other     family history of colon breast and ovarian cancer, no relationships listed    Past Medical History  Diagnosis Date  . Allergy 2010    medication and seasonal  . Special screening for malignant neoplasms, colon 2013  . Obesity, unspecified  2013  . Cancer [facility]) 2010    right breast  . Malignant neoplasm of upper-inner quadrant of female breast ([place]) 2010  . Solitary cyst of breast 2012    left    Past Surgical History  Procedure Laterality Date  . Colonoscopy  2010    Dr. Lyndel Safe, [place]  . Upper gi endoscopy  2010  . Foot surgery  1996  . Tonsillectomy    . Appendectomy    . Nasal sinus surgery  1991  . Skin cancer excision  2013    basel cell on forehead  . Knee surgery Right 2013    replacement  . Knee arthroscopy Right 2012    May and October  . Tubal ligation    . Breast surgery Right 2010    T2, N0, ER/PR positive,  HER-2/neu negative, Oncotype recurrence score  of 10 ( 7% risk of recurrence)   . Breast cyst excision Right 2006    fibroid cyst  . Breast biopsy Right 2010    Current Outpatient Prescriptions  Medication Sig Dispense Refill  . Biotin 1000 MCG tablet Take 1,000 mcg by mouth daily.    . Calcium Carbonate-Vitamin D (CALCIUM-VITAMIN D3 PO) Take 1,200 mg by mouth daily. 1263m Ca and 1000units of vit D    . Multiple Vitamins-Minerals (CENTRUM SILVER PO) Take 1 tablet by mouth daily. Reported on 02/18/2015    . Multiple Vitamins-Minerals (EYE VITAMINS PO) Take 1 tablet by mouth 2 (two) times daily. 1 in morning and 1 at night    . venlafaxine (EFFEXOR) 37.5 MG tablet Take 37.5 mg by mouth at bedtime. Reported on 02/18/2015    . vitamin B-12 (CYANOCOBALAMIN) 1000 MCG tablet Take 1,000 mcg by mouth daily.    .Marland KitchenVITAMIN D, CHOLECALCIFEROL, PO Take 7,000 Units by mouth daily.      No current facility-administered medications for this visit.    Allergies as of 03/15/2015 - Review Complete 03/15/2015  Allergen Reaction Noted  . Levaquin [levofloxacin in d5w] Nausea And Vomiting 06/06/2012    Vitals: BP 118/78 mmHg  Pulse 63  Ht 5' 10"  (1.778 m)  Wt 208 lb 9.6 oz (94.62 kg)  BMI 29.93 kg/m2  SpO2 96% Last Weight:  Wt Readings from Last 1 Encounters:  03/15/15 208 lb 9.6 oz (94.62 kg)   Last Height:   Ht Readings from Last 1 Encounters:  03/15/15 5' 10"  (1.778 m)     Physical exam: Exam: Gen: NAD, flat affect, appears depressed  CV: RRR, no MRG. No Carotid Bruits. No peripheral edema, warm, nontender Eyes: Conjunctivae clear without exudates or hemorrhage  Neuro: Detailed Neurologic Exam  Speech:  Speech is normal; fluent and spontaneous with normal comprehension.  Cognition: MoCA 25/30 (-2 visual-spatial, -1 naming, -1 delayed recall, minus one serial 7)  The patient is oriented to person, place, and time;  Cranial Nerves:  The pupils are equal,  round, and reactive to light. Visual fields are full. Extraocular movements are intact. Trigeminal sensation is intact and the muscles of mastication are normal. The face is symmetric.  Hearing intact. Voice is normal. Shoulder shrug is normal.  Gait:  No ataxia, no shuffling, good arm swing  Motor Observation:  No asymmetry, no atrophy, and no involuntary movements noted. Tone:  Normal muscle tone.   Posture:  Posture is normal. normal erect   Strength:  Strength is V/V in the upper and lower limbs.          Assessment/Plan: 70year old female here for  follow up of multiple complaints in the setting of  significant psychiatric stressors. She complains of memory changes, tremor, insomnia, an episode of alteration of consciousness in the setting of her doctor telling her something unpleasant, dizziness. Tremor improved with decrease of venlafaxine, but she is restarting Venlafaxine due to depression and has been seen by a psychiatrist recently. Tremor still affects her eating and writing and fine motor tasks. No signs of parkinsonism on exam. Exam is otherwise nonfocal. Needs continued follow-up with psychiatry as it appears as her depression and anxiety is significant.  Reviewed notes from [facility] internal medicine. She has been seen there on a regular basis for ongoing problems with dizziness and vertigo. Per notes, MRI of the brain was normal and carotid ultrasound normal. CBC was normal. B12 level normal. They have tried her on meclizine, Phenergan and Transderm scopolamine patch but she still has episodes of vertigo and feels like the room is spinning, with nausea, no vomiting and  None of the medications have worked. She also has arthritic pains and meloxicam was restarted. She has anxiety and depression. In the past she was on venlafaxine and they felt that she was depressed over her state and venlafaxine was restarted. She is not sleeping well with significant  insomnia, has a very flat affect and looks depressed.   For the transient episode of alteration of awareness: Does not sound like a seizure, may be a nonepileptic event in the setting of stress. We'll order an EEG.  Tremor may be due to medication, physiologic tremor and anxiety vs essential tremor. It helped when she decreased her venlafaxine. Started Neurontin at very low dose for tremor but had dizziness and stopped.   Memory changes: . Discussed formal neurocognitive testing in the past. There is likely a contribution of depression and anxiety.  Her fatigue severity scale was 35 and her Epworth Sleepiness Scale is 2. Not really concerning for obstructive sleep apnea and previous unremarkable sleep study.  For her dizziness and vertigo: Agree with ENT evaluation per PCP. Recent MRI of the brain was by report unremarkable. We'll order a CTA of the blood vessels of the head to evaluate for any vertebrobasilar insufficiency that could be causing her symptoms.    Addendum: B12 658 and TSH within normal limits collected February 2016.  Cc: Dr. Drema Halon, MD  [facility] [address] [place], [place] [address]  Phone [phone] Fax [phone]  A total of 45 minutes was spent face-to-face with this patient. Over half this time was spent on counseling patient on the transient alteration of awareness, tremor, memory changes, dizziness diagnosis and different diagnostic and therapeutic options available.

## 2015-03-15 NOTE — Patient Instructions (Addendum)
Remember to drink plenty of fluid, eat healthy meals and do not skip any meals. Try to eat protein with a every meal and eat a healthy snack such as fruit or nuts in between meals. Try to keep a regular sleep-wake schedule and try to exercise daily, particularly in the form of walking, 20-30 minutes a day, if you can.   As far as diagnostic testing: Imaging of the of the head and neck, EEG  Our phone number is [phone]. We also have an after hours call service for urgent matters and there is a physician on-call for urgent questions. For any emergencies you know to call 911 or go to the nearest emergency room

## 2015-03-19 ENCOUNTER — Emergency Department: Payer: Commercial Managed Care - HMO

## 2015-03-19 ENCOUNTER — Encounter: Payer: Self-pay | Admitting: Emergency Medicine

## 2015-03-19 ENCOUNTER — Emergency Department
Admission: EM | Admit: 2015-03-19 | Discharge: 2015-03-19 | Disposition: A | Discharge status: Home / Self Care | Payer: Commercial Managed Care - HMO | Attending: Student | Admitting: Student

## 2015-03-19 ENCOUNTER — Telehealth: Payer: Self-pay | Admitting: Neurology

## 2015-03-19 DIAGNOSIS — Z79899 Other long term (current) drug therapy: Secondary | ICD-10-CM | POA: Insufficient documentation

## 2015-03-19 DIAGNOSIS — F419 Anxiety disorder, unspecified: Secondary | ICD-10-CM | POA: Insufficient documentation

## 2015-03-19 DIAGNOSIS — G4700 Insomnia, unspecified: Secondary | ICD-10-CM | POA: Insufficient documentation

## 2015-03-19 DIAGNOSIS — F411 Generalized anxiety disorder: Secondary | ICD-10-CM | POA: Insufficient documentation

## 2015-03-19 DIAGNOSIS — R404 Transient alteration of awareness: Secondary | ICD-10-CM | POA: Insufficient documentation

## 2015-03-19 DIAGNOSIS — R410 Disorientation, unspecified: Secondary | ICD-10-CM | POA: Diagnosis not present

## 2015-03-19 DIAGNOSIS — R42 Dizziness and giddiness: Secondary | ICD-10-CM | POA: Insufficient documentation

## 2015-03-19 DIAGNOSIS — I10 Essential (primary) hypertension: Secondary | ICD-10-CM | POA: Insufficient documentation

## 2015-03-19 DIAGNOSIS — R4182 Altered mental status, unspecified: Secondary | ICD-10-CM | POA: Diagnosis present

## 2015-03-19 DIAGNOSIS — R202 Paresthesia of skin: Secondary | ICD-10-CM | POA: Diagnosis not present

## 2015-03-19 DIAGNOSIS — F32A Depression, unspecified: Secondary | ICD-10-CM | POA: Insufficient documentation

## 2015-03-19 DIAGNOSIS — F329 Major depressive disorder, single episode, unspecified: Secondary | ICD-10-CM | POA: Diagnosis not present

## 2015-03-19 LAB — COMPREHENSIVE METABOLIC PANEL
ALT: 8 U/L — ABNORMAL LOW (ref 14–54)
AST: 20 U/L (ref 15–41)
Albumin: 4.9 g/dL (ref 3.5–5.0)
Alkaline Phosphatase: 91 U/L (ref 38–126)
Anion gap: 7 (ref 5–15)
BUN: 12 mg/dL (ref 6–20)
CHLORIDE: 103 mmol/L (ref 101–111)
CO2: 26 mmol/L (ref 22–32)
Calcium: 10.5 mg/dL — ABNORMAL HIGH (ref 8.9–10.3)
Creatinine, Ser: 0.77 mg/dL (ref 0.44–1.00)
GFR calc Af Amer: >60 mL/min (ref >60)
Glucose, Bld: 104 mg/dL — ABNORMAL HIGH (ref 65–99)
POTASSIUM: 4 mmol/L (ref 3.5–5.1)
Sodium: 136 mmol/L (ref 135–145)
Total Bilirubin: 0.6 mg/dL (ref 0.3–1.2)
Total Protein: 7.8 g/dL (ref 6.5–8.1)

## 2015-03-19 LAB — CBC WITH DIFFERENTIAL/PLATELET
BASOS ABS: 0.1 10*3/uL (ref 0–0.1)
BASOS PCT: 1 %
EOS PCT: 2 %
Eosinophils Absolute: 0.2 10*3/uL (ref 0–0.7)
HCT: 43.1 % (ref 35.0–47.0)
Hemoglobin: 14.7 g/dL (ref 12.0–16.0)
Lymphocytes Relative: 31 %
Lymphs Abs: 2.6 10*3/uL (ref 1.0–3.6)
MCH: 30.1 pg (ref 26.0–34.0)
MCHC: 34 g/dL (ref 32.0–36.0)
MCV: 88.6 fL (ref 80.0–100.0)
MONO ABS: 0.6 10*3/uL (ref 0.2–0.9)
Monocytes Relative: 7 %
Neutro Abs: 5.1 10*3/uL (ref 1.4–6.5)
Neutrophils Relative %: 59 %
PLATELETS: 283 10*3/uL (ref 150–440)
RBC: 4.86 MIL/uL (ref 3.80–5.20)
RDW: 13.6 % (ref 11.5–14.5)
WBC: 8.5 10*3/uL (ref 3.6–11.0)

## 2015-03-19 LAB — URINALYSIS COMPLETE WITH MICROSCOPIC (ARMC ONLY)
BILIRUBIN URINE: NEGATIVE
Bacteria, UA: NONE SEEN
GLUCOSE, UA: NEGATIVE mg/dL
HGB URINE DIPSTICK: NEGATIVE
KETONES UR: NEGATIVE mg/dL
NITRITE: NEGATIVE
Protein, ur: NEGATIVE mg/dL
SPECIFIC GRAVITY, URINE: 1.014 (ref 1.005–1.030)
pH: 6 (ref 5.0–8.0)

## 2015-03-19 LAB — TROPONIN I

## 2015-03-19 MED ORDER — DULOXETINE HCL 20 MG PO CPEP
40.0000 mg | ORAL_CAPSULE | Freq: Once | ORAL | Status: AC
  Administered 2015-03-19: 40 mg via ORAL
  Filled 2015-03-19: qty 2

## 2015-03-19 MED ORDER — DULOXETINE HCL 20 MG PO CPEP: 40.0000 mg | ORAL_CAPSULE | Freq: Every day | ORAL | Status: DC

## 2015-03-19 NOTE — ED Notes (Signed)
Pt a&o, daughters here with pt, states she has seemed confused, like calling people in the middle of the night. States they brought her here to have a psych consult bc she keeps telling them she "thinks she is going to die"  Pt denies SI.

## 2015-03-19 NOTE — ED Notes (Addendum)
Patient transported to CT and given warm blankets at this time.

## 2015-03-19 NOTE — ED Notes (Signed)
SOC on and in place in pt room at this time, pt and family instructed on use and verbalized understanding.

## 2015-03-19 NOTE — ED Notes (Signed)
MD Gayle at bedside. 

## 2015-03-19 NOTE — Telephone Encounter (Signed)
Patient's daughter Jocelyn Lamer is calling and states she wonders if her mother really needs the EEG as she is thinking her mother's symptoms seem like nerve damage. She is having a burning running down the back, a crackling at the back of her neck, and recently complaining of a feeling of something crawling under her skin on the arms.  She is asking if some kind of testing of the nerves s/b done.  Please call.

## 2015-03-19 NOTE — Telephone Encounter (Signed)
Dr Kelton Pillar and spoke to daughter. Per Dr [name], she needs to still have EEG d/t awareness alternation. The nerve sx she is experiencing, she would have to be evaluated by PCP and/or have referral sent to [place]. Dr [name] has not seen her for this problem. She understands but states "At this point I might just bring her to the hospital to see what tests can be done there. I will call back to let you know if we are going to cancel EEG.". Encouraged that she should have EEG done. She replied "She will just probably have that done at [facility] if that's the case". Told her to call back with further questions/concerns.

## 2015-03-19 NOTE — ED Notes (Signed)
Per family she had multiple issues    And she is here for beh mental eval.

## 2015-03-19 NOTE — ED Notes (Signed)
Pt ambulatory to bathroom at this time, tolerating well, NAD noted at this time.

## 2015-03-19 NOTE — ED Provider Notes (Signed)
[facility] Emergency Department Provider Note  ____________________________________________  Time seen: Approximately 6:50 PM  I have reviewed the triage vital signs and the nursing notes.   HISTORY  Chief Complaint Altered Mental Status    HPI Katherine Frye is a 70 y.o. female with history of significant psychiatric stressors, hyperlipidemia, hypertension, irritable bowel syndrome who presents for evaluation of multiple complaints. According to the patient and family at bedside, the patient has had severe burning pain in the neck, back, legs for some time. She denies this pain currently but reports that typically is worse at night and in the morning. She reports the pain makes her feel so bad that she feels as if she is going to die. Family reports that when she is in severe pain she can become quite frenzied, made comments about wanting to finalize her will, sometimes she will seem stumped and confused intermittently. Patient denies any pain complaints at this time. She denies any chest pain, difficulty breathing, abdominal pain, vomiting, diarrhea, fevers or chills patient denies any numbness or weakness. She reports that her pain causes her to have severe anxiety. She is endorsing some symptoms of depression however no suicidal ideation, no homicidal ideation or audiovisual hallucinations. She is being followed by her primary care doctor as well as by a neurologist for all of these ongoing complaints. Specifically tonight, she and her family are wishing to speak with a psychiatrist as well.     Past Medical History  Diagnosis Date  . Allergy 2010    medication and seasonal  . Special screening for malignant neoplasms, colon 2013  . Obesity, unspecified 2013  . Cancer [facility]) 2010    right breast  . Malignant neoplasm of upper-inner quadrant of female breast ([place]) 2010  . Solitary cyst of breast 2012    left    Patient Active Problem List   Diagnosis Date  Noted  . Depression 03/19/2015  . Anxiety state 03/19/2015  . Insomnia 03/19/2015  . Dizziness 03/19/2015  . Transient alteration of awareness 03/19/2015  . Tremor 06/05/2014  . Memory loss 06/05/2014  . Cancer ([place])   . Malignant neoplasm of upper-inner quadrant of female breast [facility])     Past Surgical History  Procedure Laterality Date  . Colonoscopy  2010    Dr. Lyndel Safe, [place]  . Upper gi endoscopy  2010  . Foot surgery  1996  . Tonsillectomy    . Appendectomy    . Nasal sinus surgery  1991  . Skin cancer excision  2013    basel cell on forehead  . Knee surgery Right 2013    replacement  . Knee arthroscopy Right 2012    May and October  . Tubal ligation    . Breast surgery Right 2010    T2, N0, ER/PR positive, HER-2/neu negative, Oncotype recurrence score  of 10 ( 7% risk of recurrence)   . Breast cyst excision Right 2006    fibroid cyst  . Breast biopsy Right 2010    Current Outpatient Rx  Name  Route  Sig  Dispense  Refill  . Biotin 1000 MCG tablet   Oral   Take 1,000 mcg by mouth daily.         . Calcium Carbonate-Vitamin D (CALCIUM-VITAMIN D3 PO)   Oral   Take 1,200 mg by mouth daily. 1284m Ca and 1000units of vit D         . DULoxetine (CYMBALTA) 20 MG capsule   Oral  Take 2 capsules (40 mg total) by mouth daily.   15 capsule   0   . Multiple Vitamins-Minerals (CENTRUM SILVER PO)   Oral   Take 1 tablet by mouth daily. Reported on 02/18/2015         . Multiple Vitamins-Minerals (EYE VITAMINS PO)   Oral   Take 1 tablet by mouth 2 (two) times daily. 1 in morning and 1 at night         . vitamin B-12 (CYANOCOBALAMIN) 1000 MCG tablet   Oral   Take 1,000 mcg by mouth daily.         Marland Kitchen VITAMIN D, CHOLECALCIFEROL, PO   Oral   Take 7,000 Units by mouth daily.            Allergies Levaquin  Family History  Problem Relation Age of Onset  . Lymphoma Sister     non-hodgkin  . Cancer Sister     cancer of thyroid  . Cancer Other      family history of colon breast and ovarian cancer, no relationships listed    Social History Social History  Substance Use Topics  . Smoking status: Never Smoker   . Smokeless tobacco: Never Used  . Alcohol Use: No    Review of Systems Constitutional: No fever/chills Eyes: No visual changes. ENT: No sore throat. Cardiovascular: Denies chest pain. Respiratory: Denies shortness of breath. Gastrointestinal: No abdominal pain.  No nausea, no vomiting.  No diarrhea.  No constipation. Genitourinary: Negative for dysuria. Musculoskeletal: Negative for back pain at this time. Skin: Negative for rash. Neurological: Negative for headaches, focal weakness or numbness.  10-point ROS otherwise negative.  ____________________________________________   PHYSICAL EXAM:  VITAL SIGNS: ED Triage Vitals  Enc Vitals Group     BP 03/19/15 1833 123/74 mmHg     Pulse Rate 03/19/15 1833 87     Resp 03/19/15 1833 2     Temp 03/19/15 1833 97.7 F (36.5 C)     Temp Source 03/19/15 1833 Oral     SpO2 03/19/15 1833 98 %     Weight 03/19/15 1833 190 lb (86.183 kg)     Height 03/19/15 1833 5' 6" (1.676 m)     Head Cir --      Peak Flow --      Pain Score 03/19/15 1834 5     Pain Loc --      Pain Edu? --      Excl. in [place]? --     Constitutional: Alert and oriented. Well appearing and in no acute distress. Eyes: Conjunctivae are normal. PERRL. EOMI. Head: Atraumatic. Nose: No congestion/rhinnorhea. Mouth/Throat: Mucous membranes are moist.  Oropharynx non-erythematous. Neck: No stridor. No cervical spine tenderness to palpation. Cardiovascular: Normal rate, regular rhythm. Grossly normal heart sounds.  Good peripheral circulation. Respiratory: Normal respiratory effort.  No retractions. Lungs CTAB. Gastrointestinal: Soft and nontender. No distention.  No CVA tenderness. Genitourinary: deferred Musculoskeletal: No lower extremity tenderness nor edema.  No joint effusions. Neurologic:   Normal speech and language. No gross focal neurologic deficits are appreciated. No gait instability. 5 out of 5 strength in bilateral upper and lower extremity, sensation intact to light touch throughout, cranial nerves II through XII intact. Skin:  Skin is warm, dry and intact. No rash noted. Psychiatric: Mood and affect are normal. Speech and behavior are normal.  ____________________________________________   LABS (all labs ordered are listed, but only abnormal results are displayed)  Labs Reviewed  COMPREHENSIVE METABOLIC PANEL -  Abnormal; Notable for the following:    Glucose, Bld 104 (*)    Calcium 10.5 (*)    ALT 8 (*)    All other components within normal limits  URINALYSIS COMPLETEWITH MICROSCOPIC ([facility] ONLY) - Abnormal; Notable for the following:    Color, Urine YELLOW (*)    APPearance CLEAR (*)    Leukocytes, UA TRACE (*)    Squamous Epithelial / LPF 0-5 (*)    All other components within normal limits  CBC WITH DIFFERENTIAL/PLATELET  TROPONIN I   ____________________________________________  EKG  ED ECG REPORT I, ,  A, the attending physician, personally viewed and interpreted this ECG.   Date: 03/19/2015  EKG Time: 19:28  Rate: 60  Rhythm: normal sinus rhythm  Axis: borderline left axis  Intervals:none  ST&T Change: No acute ST elevation. No acute ST depression.  ____________________________________________  RADIOLOGY  CXR IMPRESSION: No active cardiopulmonary disease.  CT head IMPRESSION: 1. No acute intracranial pathology seen on CT. 2. Mild small vessel ischemic microangiopathy. ____________________________________________   PROCEDURES  Procedure(s) performed: None  Critical Care performed: No  ____________________________________________   INITIAL IMPRESSION / ASSESSMENT AND PLAN / ED COURSE  Pertinent labs & imaging results that were available during my care of the patient were reviewed by me and considered in my medical  decision making (see chart for details).  Cassara H Rhatigan is a 69 y.o. female with history of significant psychiatric stressors, hyperlipidemia, hypertension, irritable bowel syndrome who presents for evaluation of multiple complaints. On exam, she is well-appearing and in no acute distress. Vital signs stable, she is afebrile. She has a benign physical examination, intact neurological examination, no pain complaints at this time. She is alert and oriented 4, speaking clearly, following all commands. We'll obtain screening labs, CT head, chest x-ray given her intermittent confusion. Will consult telepsychiatrist on-call. No SI, HI or audiovisual hallucinations, no indication for commitment.  ----------------------------------------- 10:41 PM on 03/19/2015 ----------------------------------------- Labs and imaging reviewed and are generally unremarkable. Specialist on-call has evaluated the patient, recommends discontinuation of Effexor and starting Cymbalta, no indication for admission. Specialist on-call reports most likely etiology of her symptoms is depression caused by chronic pain as well as poor sleep. I discussed this with the patient as well as her family at bedside. We also discussed need for close PCP follow-up, close outpatient psychiatric follow-up as well as likely pain clinic given her pain has been ongoing and poorly controlled. We discussed return precautions, need for close follow-up and family is comfortable with the discharge plan. DC home.  ____________________________________________   FINAL CLINICAL IMPRESSION(S) / ED DIAGNOSES  Final diagnoses:  Anxiety  Depression       A , MD 03/19/15 2336 

## 2015-03-21 DIAGNOSIS — F320 Major depressive disorder, single episode, mild: Secondary | ICD-10-CM | POA: Diagnosis not present

## 2015-03-29 DIAGNOSIS — F320 Major depressive disorder, single episode, mild: Secondary | ICD-10-CM | POA: Diagnosis not present

## 2015-04-02 ENCOUNTER — Ambulatory Visit (INDEPENDENT_AMBULATORY_CARE_PROVIDER_SITE_OTHER): Payer: Commercial Managed Care - HMO | Admitting: Neurology

## 2015-04-02 ENCOUNTER — Encounter: Payer: Self-pay | Admitting: *Deleted

## 2015-04-02 DIAGNOSIS — R410 Disorientation, unspecified: Secondary | ICD-10-CM

## 2015-04-02 DIAGNOSIS — R404 Transient alteration of awareness: Secondary | ICD-10-CM

## 2015-04-02 NOTE — Procedures (Signed)
     History: Katherine Frye is a 70 year old patient with an episode of loss of consciousness in early March 2017. The patient had some agitation afterwards, with shaking of the head, with eventual recovery of consciousness. The patient is being evaluated for possible seizures.  This is a routine EEG. No skull defects are noted. Medications include calcium supplementation, Cymbalta, multivitamins, vitamin B12, vitamin D, and Effexor.  EEG classification: Dysrhythmia grade 1 generalized  Description of the recording: The background rhythms of this recording consists of a moderately well modulated medium amplitude theta frequency activity of 7 Hz that is reactive. As the record progresses, photic stimulation is performed, this results in a very minimal but bilateral photic driving response. Hyperventilation is also performed with a minimal buildup of background rhythm activities with slight increase in slowing seen. At no time during the recording does there appear to be evidence of spike or spike-wave discharges or evidence of focal slowing. EKG monitor shows no evidence of cardiac rhythm abnormalities with a heart rate of 56.  Impression: This is an abnormal EEG recording secondary to diffuse background theta frequency slowing. This is a nonspecific recording and can be seen with any process results in a mild metabolic or toxic encephalopathy, or any dementing type illness. No epileptiform discharges were seen.

## 2015-04-03 ENCOUNTER — Telehealth: Payer: Self-pay | Admitting: Neurology

## 2015-04-03 NOTE — Telephone Encounter (Signed)
Dr [name]- please advise. Looks like EEG abnormal. Thank you

## 2015-04-03 NOTE — Telephone Encounter (Signed)
I left a message for patient. There was no epileptiform activity, so no seizure activity or any activity that would make me suspicious of seizures. It was slowed which is non specific. EEGs can be slow in metabolic disorders or infectious disorders for example, when patient is ill. It can also be slowed due to medication or in dementia. I will call back.

## 2015-04-03 NOTE — Telephone Encounter (Signed)
Patient called to request EEG results °

## 2015-04-04 DIAGNOSIS — F320 Major depressive disorder, single episode, mild: Secondary | ICD-10-CM | POA: Diagnosis not present

## 2015-04-04 NOTE — Telephone Encounter (Signed)
Patient returned Dr. Cathren Laine call. Please call [phone].

## 2015-04-04 NOTE — Telephone Encounter (Addendum)
Called and spoke to pt about EEG results per Dr [name] note. She verbalized understanding. She would still like Dr [name] to call her. She wants to know what could be causing sx since EEG did not show seizures. She said Dr [name] can call her on her cell number [phone] and LVM if she does not pick up. Advised Dr [name] still seeing pt for the day and I will give her the message. She verbalized understanding.   Mailed pt EEG results per pt request.

## 2015-04-05 NOTE — Telephone Encounter (Signed)
Left a message that I called, would rather speak to her instead of leave a detailed message. Will try back.

## 2015-04-08 DIAGNOSIS — Z6831 Body mass index (BMI) 31.0-31.9, adult: Secondary | ICD-10-CM | POA: Diagnosis not present

## 2015-04-08 DIAGNOSIS — M4696 Unspecified inflammatory spondylopathy, lumbar region: Secondary | ICD-10-CM | POA: Diagnosis not present

## 2015-04-08 DIAGNOSIS — F418 Other specified anxiety disorders: Secondary | ICD-10-CM | POA: Diagnosis not present

## 2015-04-08 NOTE — Telephone Encounter (Signed)
Spoke to patient. EEG showed slowing which is nonspecific. No epileptiform activity and my suspicion for seizure is low given the story from patient and family. thanks

## 2015-04-08 NOTE — Telephone Encounter (Signed)
Patient is calling back about her EEG results.  Please call.  Thanks!

## 2015-04-09 DIAGNOSIS — M9902 Segmental and somatic dysfunction of thoracic region: Secondary | ICD-10-CM | POA: Diagnosis not present

## 2015-04-09 DIAGNOSIS — M5413 Radiculopathy, cervicothoracic region: Secondary | ICD-10-CM | POA: Diagnosis not present

## 2015-04-09 DIAGNOSIS — M546 Pain in thoracic spine: Secondary | ICD-10-CM | POA: Diagnosis not present

## 2015-04-09 DIAGNOSIS — M9901 Segmental and somatic dysfunction of cervical region: Secondary | ICD-10-CM | POA: Diagnosis not present

## 2015-04-09 DIAGNOSIS — M50323 Other cervical disc degeneration at C6-C7 level: Secondary | ICD-10-CM | POA: Diagnosis not present

## 2015-04-10 DIAGNOSIS — M5413 Radiculopathy, cervicothoracic region: Secondary | ICD-10-CM | POA: Diagnosis not present

## 2015-04-10 DIAGNOSIS — M9902 Segmental and somatic dysfunction of thoracic region: Secondary | ICD-10-CM | POA: Diagnosis not present

## 2015-04-10 DIAGNOSIS — M546 Pain in thoracic spine: Secondary | ICD-10-CM | POA: Diagnosis not present

## 2015-04-10 DIAGNOSIS — M50323 Other cervical disc degeneration at C6-C7 level: Secondary | ICD-10-CM | POA: Diagnosis not present

## 2015-04-10 DIAGNOSIS — M9901 Segmental and somatic dysfunction of cervical region: Secondary | ICD-10-CM | POA: Diagnosis not present

## 2015-04-11 DIAGNOSIS — F320 Major depressive disorder, single episode, mild: Secondary | ICD-10-CM | POA: Diagnosis not present

## 2015-04-12 DIAGNOSIS — M47816 Spondylosis without myelopathy or radiculopathy, lumbar region: Secondary | ICD-10-CM | POA: Diagnosis not present

## 2015-04-16 DIAGNOSIS — F320 Major depressive disorder, single episode, mild: Secondary | ICD-10-CM | POA: Diagnosis not present

## 2015-04-23 ENCOUNTER — Ambulatory Visit (HOSPITAL_COMMUNITY)
Admission: RE | Admit: 2015-04-23 | Discharge: 2015-04-23 | Disposition: A | Discharge status: Home / Self Care | Payer: Commercial Managed Care - HMO | Attending: Psychiatry | Admitting: Psychiatry

## 2015-04-23 DIAGNOSIS — M5136 Other intervertebral disc degeneration, lumbar region: Secondary | ICD-10-CM | POA: Diagnosis not present

## 2015-04-23 DIAGNOSIS — M542 Cervicalgia: Secondary | ICD-10-CM | POA: Diagnosis not present

## 2015-04-23 DIAGNOSIS — M546 Pain in thoracic spine: Secondary | ICD-10-CM | POA: Diagnosis not present

## 2015-04-23 DIAGNOSIS — R208 Other disturbances of skin sensation: Secondary | ICD-10-CM | POA: Diagnosis not present

## 2015-04-23 DIAGNOSIS — M549 Dorsalgia, unspecified: Secondary | ICD-10-CM | POA: Diagnosis not present

## 2015-04-24 ENCOUNTER — Emergency Department (HOSPITAL_COMMUNITY)
Admission: EM | Admit: 2015-04-24 | Discharge: 2015-04-24 | Disposition: A | Discharge status: Home / Self Care | Payer: Commercial Managed Care - HMO | Attending: Emergency Medicine | Admitting: Emergency Medicine

## 2015-04-24 ENCOUNTER — Encounter (HOSPITAL_COMMUNITY): Payer: Self-pay | Admitting: *Deleted

## 2015-04-24 ENCOUNTER — Encounter (HOSPITAL_COMMUNITY): Payer: Self-pay

## 2015-04-24 ENCOUNTER — Inpatient Hospital Stay (HOSPITAL_COMMUNITY)
Admission: AD | Admit: 2015-04-24 | Discharge: 2015-05-03 | DRG: 885 | Disposition: A | Discharge status: Home / Self Care | Payer: Commercial Managed Care - HMO | Source: Intra-hospital | Attending: Psychiatry | Admitting: Psychiatry

## 2015-04-24 DIAGNOSIS — Z807 Family history of other malignant neoplasms of lymphoid, hematopoietic and related tissues: Secondary | ICD-10-CM | POA: Diagnosis not present

## 2015-04-24 DIAGNOSIS — G4700 Insomnia, unspecified: Secondary | ICD-10-CM | POA: Diagnosis present

## 2015-04-24 DIAGNOSIS — M1990 Unspecified osteoarthritis, unspecified site: Secondary | ICD-10-CM | POA: Diagnosis present

## 2015-04-24 DIAGNOSIS — Z79891 Long term (current) use of opiate analgesic: Secondary | ICD-10-CM | POA: Insufficient documentation

## 2015-04-24 DIAGNOSIS — Z808 Family history of malignant neoplasm of other organs or systems: Secondary | ICD-10-CM

## 2015-04-24 DIAGNOSIS — Z853 Personal history of malignant neoplasm of breast: Secondary | ICD-10-CM | POA: Insufficient documentation

## 2015-04-24 DIAGNOSIS — F32A Depression, unspecified: Secondary | ICD-10-CM

## 2015-04-24 DIAGNOSIS — F411 Generalized anxiety disorder: Secondary | ICD-10-CM | POA: Diagnosis not present

## 2015-04-24 DIAGNOSIS — Z8041 Family history of malignant neoplasm of ovary: Secondary | ICD-10-CM | POA: Diagnosis not present

## 2015-04-24 DIAGNOSIS — G8929 Other chronic pain: Secondary | ICD-10-CM | POA: Diagnosis not present

## 2015-04-24 DIAGNOSIS — Z85828 Personal history of other malignant neoplasm of skin: Secondary | ICD-10-CM

## 2015-04-24 DIAGNOSIS — M25552 Pain in left hip: Secondary | ICD-10-CM | POA: Diagnosis not present

## 2015-04-24 DIAGNOSIS — F419 Anxiety disorder, unspecified: Secondary | ICD-10-CM

## 2015-04-24 DIAGNOSIS — M545 Low back pain: Secondary | ICD-10-CM | POA: Diagnosis not present

## 2015-04-24 DIAGNOSIS — F321 Major depressive disorder, single episode, moderate: Secondary | ICD-10-CM | POA: Diagnosis not present

## 2015-04-24 DIAGNOSIS — F329 Major depressive disorder, single episode, unspecified: Secondary | ICD-10-CM | POA: Diagnosis not present

## 2015-04-24 DIAGNOSIS — F418 Other specified anxiety disorders: Secondary | ICD-10-CM | POA: Insufficient documentation

## 2015-04-24 DIAGNOSIS — F322 Major depressive disorder, single episode, severe without psychotic features: Secondary | ICD-10-CM | POA: Insufficient documentation

## 2015-04-24 LAB — COMPREHENSIVE METABOLIC PANEL
ALBUMIN: 4.7 g/dL (ref 3.5–5.0)
ALK PHOS: 80 U/L (ref 38–126)
ALT: 9 U/L — AB (ref 14–54)
ANION GAP: 10 (ref 5–15)
AST: 21 U/L (ref 15–41)
BILIRUBIN TOTAL: 0.7 mg/dL (ref 0.3–1.2)
BUN: 16 mg/dL (ref 6–20)
CALCIUM: 6.9 mg/dL — AB (ref 8.9–10.3)
CO2: 26 mmol/L (ref 22–32)
CREATININE: 0.78 mg/dL (ref 0.44–1.00)
Chloride: 99 mmol/L — ABNORMAL LOW (ref 101–111)
GFR calc Af Amer: >60 mL/min (ref >60)
GFR calc non Af Amer: >60 mL/min (ref >60)
GLUCOSE: 100 mg/dL — AB (ref 65–99)
Potassium: 6.4 mmol/L (ref 3.5–5.1)
Sodium: 135 mmol/L (ref 135–145)
TOTAL PROTEIN: 7.5 g/dL (ref 6.5–8.1)

## 2015-04-24 LAB — CBC
HEMATOCRIT: 40 % (ref 36.0–46.0)
HEMOGLOBIN: 13.8 g/dL (ref 12.0–15.0)
MCH: 30.6 pg (ref 26.0–34.0)
MCHC: 34.5 g/dL (ref 30.0–36.0)
MCV: 88.7 fL (ref 78.0–100.0)
Platelets: 286 10*3/uL (ref 150–400)
RBC: 4.51 MIL/uL (ref 3.87–5.11)
RDW: 13.6 % (ref 11.5–15.5)
WBC: 8.8 10*3/uL (ref 4.0–10.5)

## 2015-04-24 LAB — BASIC METABOLIC PANEL
ANION GAP: 8 (ref 5–15)
BUN: 15 mg/dL (ref 6–20)
CALCIUM: 10.5 mg/dL — AB (ref 8.9–10.3)
CO2: 25 mmol/L (ref 22–32)
Chloride: 104 mmol/L (ref 101–111)
Creatinine, Ser: 0.8 mg/dL (ref 0.44–1.00)
GFR calc Af Amer: >60 mL/min (ref >60)
GLUCOSE: 104 mg/dL — AB (ref 65–99)
POTASSIUM: 4.1 mmol/L (ref 3.5–5.1)
SODIUM: 137 mmol/L (ref 135–145)

## 2015-04-24 LAB — RAPID URINE DRUG SCREEN, HOSP PERFORMED
Amphetamines: NOT DETECTED
BARBITURATES: NOT DETECTED
Benzodiazepines: NOT DETECTED
Cocaine: NOT DETECTED
Opiates: NOT DETECTED
TETRAHYDROCANNABINOL: NOT DETECTED

## 2015-04-24 LAB — SALICYLATE LEVEL: Salicylate Lvl: <4 mg/dL (ref 2.8–30.0)

## 2015-04-24 LAB — ACETAMINOPHEN LEVEL

## 2015-04-24 LAB — ETHANOL: Alcohol, Ethyl (B): <5 mg/dL (ref <5)

## 2015-04-24 MED ORDER — VITAMIN D3 25 MCG (1000 UNIT) PO TABS
7000.0000 [IU] | ORAL_TABLET | Freq: Every day | ORAL | Status: DC
  Administered 2015-04-25 – 2015-05-02 (×8): 7000 [IU] via ORAL
  Filled 2015-04-24 (×10): qty 7

## 2015-04-24 MED ORDER — ADULT MULTIVITAMIN W/MINERALS CH
1.0000 | ORAL_TABLET | Freq: Every day | ORAL | Status: DC
  Administered 2015-04-25 – 2015-05-02 (×8): 1 via ORAL
  Filled 2015-04-24 (×10): qty 1

## 2015-04-24 MED ORDER — CENTRUM SILVER PO TABS: 1.0000 | ORAL_TABLET | Freq: Every day | ORAL | Status: DC

## 2015-04-24 MED ORDER — HYDROXYZINE HCL 25 MG PO TABS
25.0000 mg | ORAL_TABLET | Freq: Two times a day (BID) | ORAL | Status: DC
  Administered 2015-04-24 – 2015-05-02 (×17): 25 mg via ORAL
  Filled 2015-04-24 (×26): qty 1

## 2015-04-24 MED ORDER — LORAZEPAM 1 MG PO TABS: 1.0000 mg | ORAL_TABLET | Freq: Four times a day (QID) | ORAL | Status: DC | PRN

## 2015-04-24 MED ORDER — DULOXETINE HCL 20 MG PO CPEP
40.0000 mg | ORAL_CAPSULE | Freq: Every day | ORAL | Status: DC
  Filled 2015-04-24: qty 2

## 2015-04-24 MED ORDER — DULOXETINE HCL 60 MG PO CPEP
60.0000 mg | ORAL_CAPSULE | Freq: Every day | ORAL | Status: DC
  Administered 2015-04-25 – 2015-04-26 (×2): 60 mg via ORAL
  Filled 2015-04-24 (×4): qty 1

## 2015-04-24 MED ORDER — DULOXETINE HCL 60 MG PO CPEP: 60.0000 mg | ORAL_CAPSULE | Freq: Every day | ORAL | Status: DC

## 2015-04-24 MED ORDER — CALCIUM CARBONATE-VITAMIN D 500-200 MG-UNIT PO TABS
2.0000 | ORAL_TABLET | Freq: Every day | ORAL | Status: DC
  Administered 2015-04-24: 2 via ORAL
  Filled 2015-04-24: qty 2

## 2015-04-24 MED ORDER — VITAMIN D3 25 MCG (1000 UNIT) PO TABS
7000.0000 [IU] | ORAL_TABLET | Freq: Every day | ORAL | Status: DC
  Administered 2015-04-24: 7000 [IU] via ORAL
  Filled 2015-04-24: qty 7

## 2015-04-24 MED ORDER — VITAMIN B-12 1000 MCG PO TABS
1000.0000 ug | ORAL_TABLET | Freq: Every day | ORAL | Status: DC
  Administered 2015-04-25 – 2015-05-02 (×8): 1000 ug via ORAL
  Filled 2015-04-24 (×10): qty 1

## 2015-04-24 MED ORDER — CALCIUM-VITAMIN D3 600-125 MG-UNIT PO TABS: 2.0000 | ORAL_TABLET | Freq: Every day | ORAL | Status: DC

## 2015-04-24 MED ORDER — HYDROXYZINE HCL 25 MG PO TABS: 25.0000 mg | ORAL_TABLET | Freq: Two times a day (BID) | ORAL | Status: DC

## 2015-04-24 MED ORDER — VITAMIN B-12 1000 MCG PO TABS
1000.0000 ug | ORAL_TABLET | Freq: Every day | ORAL | Status: DC
  Administered 2015-04-24: 1000 ug via ORAL
  Filled 2015-04-24: qty 1

## 2015-04-24 MED ORDER — ADULT MULTIVITAMIN W/MINERALS CH
1.0000 | ORAL_TABLET | Freq: Every day | ORAL | Status: DC
  Administered 2015-04-24: 1 via ORAL
  Filled 2015-04-24: qty 1

## 2015-04-24 MED ORDER — CALCIUM CARBONATE-VITAMIN D 500-200 MG-UNIT PO TABS
2.0000 | ORAL_TABLET | Freq: Every day | ORAL | Status: DC
  Administered 2015-04-25 – 2015-04-28 (×4): 2 via ORAL
  Administered 2015-04-29: 1 via ORAL
  Administered 2015-04-30 – 2015-05-02 (×3): 2 via ORAL
  Filled 2015-04-24 (×10): qty 2

## 2015-04-24 MED ORDER — LORAZEPAM 1 MG PO TABS: 2.0000 mg | ORAL_TABLET | Freq: Four times a day (QID) | ORAL | Status: DC | PRN

## 2015-04-24 MED ORDER — TRAMADOL HCL 50 MG PO TABS
50.0000 mg | ORAL_TABLET | Freq: Four times a day (QID) | ORAL | Status: DC | PRN
  Filled 2015-04-24: qty 1

## 2015-04-24 MED ORDER — LORAZEPAM 1 MG PO TABS
1.0000 mg | ORAL_TABLET | Freq: Four times a day (QID) | ORAL | Status: DC | PRN
  Administered 2015-04-28 – 2015-05-03 (×2): 1 mg via ORAL
  Filled 2015-04-24 (×2): qty 1

## 2015-04-24 MED ORDER — IBUPROFEN 400 MG PO TABS
400.0000 mg | ORAL_TABLET | Freq: Four times a day (QID) | ORAL | Status: DC | PRN
  Administered 2015-04-25 – 2015-04-28 (×4): 400 mg via ORAL
  Filled 2015-04-24 (×4): qty 1

## 2015-04-24 MED ORDER — IBUPROFEN 200 MG PO TABS: 400.0000 mg | ORAL_TABLET | Freq: Four times a day (QID) | ORAL | Status: DC | PRN

## 2015-04-24 NOTE — ED Notes (Signed)
Patient requesting something for back pain. Patient offered Tramadol. Patient states she was told to stop taking Tramadol two days ago, but it unsure why she was told this. Alvino Chapel, NP made aware of same. NP states patient may have Tramadol for pain. Patient updated on same.

## 2015-04-24 NOTE — Progress Notes (Signed)
Entered in d/c instructions  Melvenia Beam Go on 06/24/2015 You are scheduled for a 3 month follow up appt with Dr [name] on 06/24/15 at 9 am but if you need an earlier appt call the office to see if you can be placed on the wait list  [address] [place] [address] [phone]   Nelda Bucks E Schedule an appointment as soon as possible for a visit As needed [place] [id number] [phone]

## 2015-04-24 NOTE — Progress Notes (Signed)
Katherine Frye is a 71 year old female being admitted voluntarily to [facility] from [facility].  She came into [facility] accompanied by her daughters and niece c/o anxiety, stress, confusion and depression.   She states that she has severe back pain, tremors and dizziness and various other somatic complaints.  She has a fear that she is dying and her family in concerned because it has become an obsession.  She has seen multiple medical professionals and MRI's, EEG's and x-rays have all come back negative.  A neurosurgeon told her that she needed to have a psychiatric evaluation.  She has passive suicidal ideation making statement that "I don't want to live like this anymore."  She endorses multiple depressive symptoms of anhedonia, insomnia, tearfulness, fatigue, irritability, isolation, fears of being alone and dying, excessive worry, panic attacks and staying in bed too much.  She was confused during the assessment.  She was able to answer questions but was very nervous.  She denies SI/HI or A/V hallucinations.  "I just worry a lot."  Admission paperwork completed and signed.  Pt came in with no belonging.  Skin assessment completed and noted mastectomy right breast and old appendectomy scar.  Q 15 minute checks initiated for safety.  We will monitor the progress towards her goals.

## 2015-04-24 NOTE — BH Assessment (Addendum)
Tele Assessment Note   Katherine Frye is a caucasian, married, 70 y.o. female voluntarily presenting as a walk-in to [facility] c/o increased anxiety, stress, confusion, and depression. Pt is accompanied by her daughters and niece for support. Throughout the assessment, the pt is fixated on medical/physical complaints, including severe back pain, tremors, and dizziness, in addition to multiple other somatic complaints. She believes she is dying, despite evidence to the contrary, and family reports that the pt's fear of dying has becomes obsessive. She reportedly ruminates on dying and often will become panicked, leading her to do things such as calling friends and family in the middle of the night for support "because I don't want to die alone". Per pt and family, the pt has seen multiple medical professionals, has had EEGs, MRIs, and X-rays, and has been told that all tests have come back negative or unremarkable. The pt's most recent neurosurgeon reportedly instructed her to get a psychiatric evaluation, as it is now believed that the pt's physical complaints are psychosomatic in nature. The pt acknowledges that possible stressors could be her recent decision to retire, overwhelming fears of dying, of being alone, or her pain not going away. The pt does appear to be experiencing some depression and anxiety, as she c/o loss of appetite with associated weight loss, fatigue, insomnia, tearfulness, loss of interest in usual pleasures, increased irritability, social isolation, intense fears of being alone and dying, excessive worrying, feeling on edge, daily panic attacks, and vegetative sx such as "staying in the bed too much". The pt admits to passive SI and statements of "not wanting to live like this any more". However, she denies any intent or plan to harm herself. Pt's family noted that they began to see her sx worsen when she retired earlier this year. They explained that the pt was always an active, happy person  but that she now lies in bed all the time and is scared to be alone and often cries.  She denies HI, A/VH, any violent behaviors, or hx of SA. Family deny sx of dementia or cognitive decline apart from repeating questions, mild confusion, and some difficulty with short-term memory. They deny any major changes in personality apart from increased depression and anxiety. However, this writer noted that the pt reportedly has not been taking her medications correctly and "was found to have medication bottle strewn across several rooms". She begs people to stay with her so as not not be alone, but then runs others off from visiting. Per chart review, the pt does have memory problems and her mother suffered from dementia. Family states that the pt is often "all over the place" and behaves "erractically" at times. The pt is said to be non med-compliant, as she fears that certain medications (e.g. her Cymbalta) will kill her. Pt receives counseling services from [facility], but she does not see a psychiatrist. She has no prior psychiatric hospitalizations.  Disposition: Per Patriciaann Clan, PA-C, pt meets inpt tx criteria. Pt will be sent to [facility] for medical clearance and overnight observation.  Diagnosis: 296.22 Major depressive disorder, Single episode, Moderate; 300.82 Somatic symptom disorder; R/O Neurocognitive disorder   Past Medical History:  Past Medical History  Diagnosis Date  . Allergy 2010    medication and seasonal  . Special screening for malignant neoplasms, colon 2013  . Obesity, unspecified 2013  . Cancer [facility]) 2010    right breast  . Malignant neoplasm of upper-inner quadrant of female breast ([place]) 2010  .  Solitary cyst of breast 2012    left    Past Surgical History  Procedure Laterality Date  . Colonoscopy  2010    Dr. Lyndel Safe, [place]  . Upper gi endoscopy  2010  . Foot surgery  1996  . Tonsillectomy    . Appendectomy    . Nasal sinus surgery  1991  . Skin  cancer excision  2013    basel cell on forehead  . Knee surgery Right 2013    replacement  . Knee arthroscopy Right 2012    May and October  . Tubal ligation    . Breast surgery Right 2010    T2, N0, ER/PR positive, HER-2/neu negative, Oncotype recurrence score  of 10 ( 7% risk of recurrence)   . Breast cyst excision Right 2006    fibroid cyst  . Breast biopsy Right 2010    Family History:  Family History  Problem Relation Age of Onset  . Lymphoma Sister     non-hodgkin  . Cancer Sister     cancer of thyroid  . Cancer Other     family history of colon breast and ovarian cancer, no relationships listed    Social History:  reports that she has never smoked. She has never used smokeless tobacco. She reports that she does not drink alcohol or use illicit drugs.  Additional Social History:  Alcohol / Drug Use Pain Medications: See PTA med list Prescriptions: See PTA med list Over the Counter: See PTA med list History of alcohol / drug use?: No history of alcohol / drug abuse  CIWA:   COWS:    PATIENT STRENGTHS: (choose at least two) Ability for insight Average or above average intelligence Capable of independent living Communication skills Physical Health Supportive family/friends  Allergies:  Allergies  Allergen Reactions  . Levaquin [Levofloxacin In D5w] Nausea And Vomiting    Also dizziness and headaches    Home Medications:  (Not in a hospital admission)  OB/GYN Status:  No LMP recorded. Patient has had a hysterectomy.  General Assessment Data Location of Assessment: [facility] Assessment Services TTS Assessment: In system Is this a Tele or Face-to-Face Assessment?: Face-to-Face Is this an Initial Assessment or a Re-assessment for this encounter?: Initial Assessment Marital status: Married Is patient pregnant?: No Pregnancy Status: No Living Arrangements: Spouse/significant other Can pt return to current living arrangement?: Yes Admission Status:  Voluntary Is patient capable of signing voluntary admission?: Yes Referral Source: Self/Family/Friend Insurance type: [employer] Medicare  Medical Screening Exam ([place]) Medical Exam completed: Yes  Crisis Care Plan Living Arrangements: Spouse/significant other Legal Guardian:  (n/a) Name of Psychiatrist: None Name of Therapist: [place]  Education Status Is patient currently in school?: No Current Grade: na Highest grade of school patient has completed: na Name of school: na Contact person: na  Risk to self with the past 6 months Suicidal Ideation: Yes-Currently Present (Passive thoughts only, no plan/intention) Has patient been a risk to self within the past 6 months prior to admission? : No Suicidal Intent: No Has patient had any suicidal intent within the past 6 months prior to admission? : No Is patient at risk for suicide?: No Suicidal Plan?: No Has patient had any suicidal plan within the past 6 months prior to admission? : No Access to Means: No What has been your use of drugs/alcohol within the last 12 months?: Pt denies Previous Attempts/Gestures: No How many times?: 0 Other Self Harm Risks: None known Triggers for Past Attempts:  (  n/a) Intentional Self Injurious Behavior: None Family Suicide History: No Recent stressful life event(s): Loss (Comment), Job Loss, Recent negative physical changes (Sister passed away, no longer working, somatic complaints) Persecutory voices/beliefs?: No Depression: Yes Depression Symptoms: Insomnia, Tearfulness, Isolating, Fatigue, Loss of interest in usual pleasures, Feeling angry/irritable Substance abuse history and/or treatment for substance abuse?: No Suicide prevention information given to non-admitted patients: Not applicable  Risk to Others within the past 6 months Homicidal Ideation: No Does patient have any lifetime risk of violence toward others beyond the six months prior to admission? :  No Thoughts of Harm to Others: No Current Homicidal Intent: No Current Homicidal Plan: No Access to Homicidal Means: No Identified Victim: n/a History of harm to others?: No Assessment of Violence: None Noted Violent Behavior Description: Pt denies Does patient have access to weapons?: No Criminal Charges Pending?: No Does patient have a court date: No Is patient on probation?: No  Psychosis Hallucinations: None noted Delusions: None noted  Mental Status Report Appearance/Hygiene: Unremarkable Eye Contact: Fair Motor Activity: Freedom of movement Speech: Logical/coherent Level of Consciousness: Quiet/awake Mood: Depressed Affect: Anxious Anxiety Level: Panic Attacks Panic attack frequency: Daily Most recent panic attack: Today Thought Processes: Coherent, Relevant Judgement: Partial Orientation: Person, Place, Situation Obsessive Compulsive Thoughts/Behaviors: Severe (Ruminating thoughts on death/pain)  Cognitive Functioning Concentration: Decreased Memory: Remote Intact IQ: Average Insight: Poor Impulse Control: Fair Appetite: Poor Weight Loss: 20 (Since Dec) Weight Gain: 0 Sleep: Decreased Total Hours of Sleep: 4 Vegetative Symptoms: Staying in bed  ADLScreening [facility] Assessment Services) Patient's cognitive ability adequate to safely complete daily activities?: Yes Patient able to express need for assistance with ADLs?: Yes Independently performs ADLs?: Yes (appropriate for developmental age)  Prior Inpatient Therapy Prior Inpatient Therapy: No Prior Therapy Dates: na Prior Therapy Facilty/Provider(s): na Reason for Treatment: na  Prior Outpatient Therapy Prior Outpatient Therapy: Yes Prior Therapy Dates: Ongoing Prior Therapy Facilty/Provider(s): [facility] Reason for Treatment: Depression/Anxiety Does patient have an ACCT team?: No Does patient have Intensive In-House Services?  : No Does patient have [facility] services? : No Does  patient have P4CC services?: No  ADL Screening (condition at time of admission) Patient's cognitive ability adequate to safely complete daily activities?: Yes Is the patient deaf or have difficulty hearing?: No Does the patient have difficulty seeing, even when wearing glasses/contacts?: No Does the patient have difficulty concentrating, remembering, or making decisions?: Yes Patient able to express need for assistance with ADLs?: Yes Does the patient have difficulty dressing or bathing?: No Independently performs ADLs?: Yes (appropriate for developmental age) Does the patient have difficulty walking or climbing stairs?: No Weakness of Legs: None Weakness of Arms/Hands: None  Home Assistive Devices/Equipment Home Assistive Devices/Equipment: None    Abuse/Neglect Assessment (Assessment to be complete while patient is alone) Physical Abuse: Denies Verbal Abuse: Denies Sexual Abuse: Denies Exploitation of patient/patient's resources: Denies Self-Neglect: Denies Values / Beliefs Cultural Requests During Hospitalization: None Spiritual Requests During Hospitalization: None   Advance Directives (For Healthcare) Does patient have an advance directive?: No Would patient like information on creating an advanced directive?: No - patient declined information    Additional Information 1:1 In Past 12 Months?: No CIRT Risk: No Elopement Risk: No Does patient have medical clearance?: No     Disposition:  Disposition Initial Assessment Completed for this Encounter: Yes Disposition of Patient: Inpatient treatment program Type of inpatient treatment program: Adult  Colin Ina 04/24/2015 12:50 AM

## 2015-04-24 NOTE — BHH Counselor (Signed)
Dr. Darleene Cleaver and Charmaine Downs, NP recommend inpatient at this time. Patient accepted to [facility] (adult unit). Room assignment is #403-2. Nursing report [phone]. Support paperwork completed. Patient is voluntary and Betsy Pries will provide transport.

## 2015-04-24 NOTE — ED Notes (Signed)
Agustina Caroli: [place] [id number] [phone]  Call Jocelyn Lamer first with any admitting/discharge info. If she cant be reached, then call [name].

## 2015-04-24 NOTE — Tx Team (Signed)
Initial Interdisciplinary Treatment Plan   PATIENT STRESSORS: Health problems Recent retirement   PATIENT STRENGTHS: Motivation for treatment/growth Supportive family/friends   PROBLEM LIST: Problem List/Patient Goals Date to be addressed Date deferred Reason deferred Estimated date of resolution  Depression 04/24/15     Anxiety 04/24/15     Somatic complaints 04/24/15     "If I could get my back fixed I would be better" 04/24/15     "Would like to get the shaking better" 04/24/15                              DISCHARGE CRITERIA:  Improved stabilization in mood, thinking, and/or behavior Need for constant or close observation no longer present Verbal commitment to aftercare and medication compliance  PRELIMINARY DISCHARGE PLAN: Outpatient therapy Medication management  PATIENT/FAMIILY INVOLVEMENT: This treatment plan has been presented to and reviewed with the patient, Katherine Frye.  The patient and family have been given the opportunity to ask questions and make suggestions.  Barbette Or Maciej Schweitzer 04/24/2015, 7:35 PM

## 2015-04-24 NOTE — ED Notes (Signed)
I have given report to Johnney Ou and will dress/wand and transport there shortly.  She remains in no distress and is mildly hypervigilant in her demeanor.

## 2015-04-24 NOTE — ED Notes (Signed)
She is in no distress; and her daughter remains with her at all times.

## 2015-04-24 NOTE — ED Notes (Signed)
Patient with second phone call of the day.

## 2015-04-24 NOTE — ED Notes (Signed)
Pt is changed into scrubs and daughter Ricke Hey) is taking pt belongings with her.

## 2015-04-24 NOTE — ED Provider Notes (Signed)
CSN: [id number]     Arrival date & time 04/24/15  0121 History   First MD Initiated Contact with Patient 04/24/15 0602     Chief Complaint  Patient presents with  . Anxiety     (Consider location/radiation/quality/duration/timing/severity/associated sxs/prior Treatment) HPI Comments: Pt is here as she has been fighting with depression over the last year. Denies si/hi. She has been on cymbalta most recently but it is causing burning in her back. Pt has been seen by bh, nuerologist and pcp multiple times. Pt is requesting to have a behavioral health admission. She states that she was sent over here by bh and was told that she was going to be admitted today. She denies dysuria, numbness, or weakness.   The history is provided by the patient and a relative. No language interpreter was used.    Past Medical History  Diagnosis Date  . Allergy 2010    medication and seasonal  . Special screening for malignant neoplasms, colon 2013  . Obesity, unspecified 2013  . Cancer [facility]) 2010    right breast  . Malignant neoplasm of upper-inner quadrant of female breast ([place]) 2010  . Solitary cyst of breast 2012    left   Past Surgical History  Procedure Laterality Date  . Colonoscopy  2010    Dr. Lyndel Safe, [place]  . Upper gi endoscopy  2010  . Foot surgery  1996  . Tonsillectomy    . Appendectomy    . Nasal sinus surgery  1991  . Skin cancer excision  2013    basel cell on forehead  . Knee surgery Right 2013    replacement  . Knee arthroscopy Right 2012    May and October  . Tubal ligation    . Breast surgery Right 2010    T2, N0, ER/PR positive, HER-2/neu negative, Oncotype recurrence score  of 10 ( 7% risk of recurrence)   . Breast cyst excision Right 2006    fibroid cyst  . Breast biopsy Right 2010   Family History  Problem Relation Age of Onset  . Lymphoma Sister     non-hodgkin  . Cancer Sister     cancer of thyroid  . Cancer Other     family history of colon breast and  ovarian cancer, no relationships listed   Social History  Substance Use Topics  . Smoking status: Never Smoker   . Smokeless tobacco: Never Used  . Alcohol Use: No   OB History    Gravida Para Term Preterm AB TAB SAB Ectopic Multiple Living   2 2        2       Obstetric Comments   Age with first menstruation-17 Age with first pregnancy-21     Review of Systems  All other systems reviewed and are negative.     Allergies  Levaquin  Home Medications   Prior to Admission medications   Medication Sig Start Date End Date Taking? Authorizing Provider  Biotin 1000 MCG tablet Take 1,000 mcg by mouth daily.   Yes Historical Provider, MD  Calcium Carbonate-Vitamin D (CALCIUM-VITAMIN D3 PO) Take 1,200 mg by mouth daily. 1276m Ca and 1000units of vit D   Yes Historical Provider, MD  DULoxetine (CYMBALTA) 20 MG capsule Take 2 capsules (40 mg total) by mouth daily. Patient taking differently: Take 20 mg by mouth 2 (two) times daily.  03/19/15 03/18/16 Yes EJoanne Gavel MD  Multiple Vitamins-Minerals (CENTRUM SILVER PO) Take 1 tablet by mouth daily. Reported  on 02/18/2015   Yes Historical Provider, MD  Multiple Vitamins-Minerals (EYE VITAMINS PO) Take 1 tablet by mouth 2 (two) times daily. 1 in morning and 1 at night   Yes Historical Provider, MD  traMADol (ULTRAM) 50 MG tablet Take 50 mg by mouth every 6 (six) hours as needed for moderate pain or severe pain.  04/16/15  Yes Historical Provider, MD  vitamin B-12 (CYANOCOBALAMIN) 1000 MCG tablet Take 1,000 mcg by mouth daily.   Yes Historical Provider, MD  VITAMIN D, CHOLECALCIFEROL, PO Take 7,000 Units by mouth daily.    Yes Historical Provider, MD   BP 114/77 mmHg  Pulse 60  Temp(Src) 97.5 F (36.4 C) (Oral)  Resp 18  Ht 5' 10"  (1.778 m)  Wt 90.719 kg  BMI 28.70 kg/m2  SpO2 100% Physical Exam  Constitutional: She is oriented to person, place, and time. She appears well-developed and well-nourished.  HENT:  Head: Normocephalic and  atraumatic.  Cardiovascular: Normal rate and regular rhythm.   Pulmonary/Chest: Effort normal and breath sounds normal.  Abdominal: Soft. Bowel sounds are normal. There is no tenderness.  Musculoskeletal: Normal range of motion.  Right lumbar paraspinal tenderness. Full rom and strength of all extremities  Neurological: She is alert and oriented to person, place, and time. Coordination normal.  Skin: Skin is warm and dry.  Nursing note and vitals reviewed.   ED Course  Procedures (including critical care time) Labs Review Labs Reviewed  COMPREHENSIVE METABOLIC PANEL - Abnormal; Notable for the following:    Potassium 6.4 (*)    Chloride 99 (*)    Glucose, Bld 100 (*)    Calcium 6.9 (*)    ALT 9 (*)    All other components within normal limits  ACETAMINOPHEN LEVEL - Abnormal; Notable for the following:    Acetaminophen (Tylenol), Serum <10 (*)    All other components within normal limits  BASIC METABOLIC PANEL - Abnormal; Notable for the following:    Glucose, Bld 104 (*)    Calcium 10.5 (*)    All other components within normal limits  ETHANOL  SALICYLATE LEVEL  CBC  URINE RAPID DRUG SCREEN, HOSP PERFORMED    Imaging Review No results found. I have personally reviewed and evaluated these images and lab results as part of my medical decision-making.   EKG Interpretation None      MDM   Final diagnoses:  Depression  Anxiety    Pt was seen by tts yesterday and was sent here for medical clearance. tts is recommending inpt treatment. Pt is medically cleared    Glendell Docker, NP 04/24/15 0732  Shanon Rosser, MD 04/24/15 [phone]

## 2015-04-24 NOTE — ED Notes (Signed)
Patient given two hot packs for back pain, repositioned, in NAD. Will continue to monitor.

## 2015-04-24 NOTE — BHH Counselor (Signed)
70-yr-old female presenting as a walk-in to [facility] c/o increased anxiety attacks, stress, confusion, and depression. Pt c/o severe back pain in addition to multiple other somatic sx. She believes she is dying, despite evidence to the contrary. Per pt and family, the pt has seen multiple medical professionals and has been told that there is nothing wrong with her physically. Her most recent neurosurgeon reportedly instructed her to get a psychiatric evaluation, as it is now believed that the pt's physical/medical complaints are psychosomatic in nature. However, the pt is experiencing depressive and anxiety sx, including passive SI and statements of "not wanting to live like this any more".  Pt was sent to [facility] for medical clearance and will need to stay there for the night for observation.

## 2015-04-24 NOTE — Consult Note (Signed)
[facility] Consult   Reason for Consult: Suicide ideation, depression, anxiety  Referring Physician:  EDP Patient Identification: Katherine Frye MRN:  [id number] Principal Diagnosis: Generalized anxiety disorder Diagnosis:   Patient Active Problem List   Diagnosis Date Noted  . Generalized anxiety disorder [F41.1] 04/24/2015  . Major depression, single episode [F32.9] 04/24/2015  . Depression [F32.9] 03/19/2015  . Anxiety state [F41.1] 03/19/2015  . Insomnia [G47.00] 03/19/2015  . Dizziness [R42] 03/19/2015  . Transient alteration of awareness [R40.4] 03/19/2015  . Tremor [R25.1] 06/05/2014  . Memory loss [R41.3] 06/05/2014  . Cancer ([place]) [C80.1]   . Malignant neoplasm of upper-inner quadrant of female breast ([place]) [C50.219]     Total Time spent with patient: 45 minutes  Subjective:   Katherine Frye is a 70 y.o. female patient admitted with  Suicide ideation, depression, anxiety  HPI:  Caucasian female, 70 years old was evaluated for increased feelings of depression, anxiety and insomnia.   Patient states she has been diagnosed with depression but refused taking medications.   Patient reports that she lost her dear sister last year and is still grieving about the loss.  Patient reports that she tried Effexor in the past and had a side effect of tremor.  Patient reports her chronic back pain has caused her to feel suicidal.  Patient reports having trouble sleeping and spends her time thinking about all of her medical issues which nobody can find out what is wrong with her.  She has seen a Neurologist and was asked to come see a Psychiatrist.  Patient denies SI/HI/AVH today.  She has been accepted for admission and we have a bed assigned to her.  Past Psychiatric History:   Anxiety disorder  Risk to Self: Is patient at risk for suicide?: No Risk to Others:   Prior Inpatient Therapy:   Prior Outpatient Therapy:    Past Medical History:  Past Medical History   Diagnosis Date  . Allergy 2010    medication and seasonal  . Special screening for malignant neoplasms, colon 2013  . Obesity, unspecified 2013  . Cancer [facility]) 2010    right breast  . Malignant neoplasm of upper-inner quadrant of female breast ([place]) 2010  . Solitary cyst of breast 2012    left    Past Surgical History  Procedure Laterality Date  . Colonoscopy  2010    Dr. Lyndel Safe, [place]  . Upper gi endoscopy  2010  . Foot surgery  1996  . Tonsillectomy    . Appendectomy    . Nasal sinus surgery  1991  . Skin cancer excision  2013    basel cell on forehead  . Knee surgery Right 2013    replacement  . Knee arthroscopy Right 2012    May and October  . Tubal ligation    . Breast surgery Right 2010    T2, N0, ER/PR positive, HER-2/neu negative, Oncotype recurrence score  of 10 ( 7% risk of recurrence)   . Breast cyst excision Right 2006    fibroid cyst  . Breast biopsy Right 2010   Family History:  Family History  Problem Relation Age of Onset  . Lymphoma Sister     non-hodgkin  . Cancer Sister     cancer of thyroid  . Cancer Other     family history of colon breast and ovarian cancer, no relationships listed   Family Psychiatric  History:  Denies Social History:  History  Alcohol Use No  History  Drug Use No    Social History   Social History  . Marital Status: Married    Spouse Name: Katherine Frye "Vic"  . Number of Children: 2  . Years of Education: 12   Social History Main Topics  . Smoking status: Never Smoker   . Smokeless tobacco: Never Used  . Alcohol Use: No  . Drug Use: No  . Sexual Activity: Not Asked   Other Topics Concern  . None   Social History Narrative   Lives at home with husband.   Caffeine use: Drinks 1 cup coffee per day (sometimes 2 cups per day)      Additional Social History:    Allergies:   Allergies  Allergen Reactions  . Levaquin [Levofloxacin In D5w] Nausea And Vomiting    Also dizziness and headaches     Labs:  Results for orders placed or performed during the hospital encounter of 04/24/15 (from the past 48 hour(s))  Comprehensive metabolic panel     Status: Abnormal   Collection Time: 04/24/15  3:53 AM  Result Value Ref Range   Sodium 135 135 - 145 mmol/L   Potassium 6.4 (HH) 3.5 - 5.1 mmol/L    Comment: RESULT REPEATED AND VERIFIED NO VISIBLE HEMOLYSIS CRITICAL RESULT CALLED TO, READ BACK BY AND VERIFIED WITH: T Hassell Done RN @ 0451 ON 04/24/15 BY C DAVIS    Chloride 99 (L) 101 - 111 mmol/L   CO2 26 22 - 32 mmol/L   Glucose, Bld 100 (H) 65 - 99 mg/dL   BUN 16 6 - 20 mg/dL   Creatinine, Ser 0.78 0.44 - 1.00 mg/dL   Calcium 6.9 (L) 8.9 - 10.3 mg/dL   Total Protein 7.5 6.5 - 8.1 g/dL   Albumin 4.7 3.5 - 5.0 g/dL   AST 21 15 - 41 U/L   ALT 9 (L) 14 - 54 U/L   Alkaline Phosphatase 80 38 - 126 U/L   Total Bilirubin 0.7 0.3 - 1.2 mg/dL   GFR calc non Af Amer >60 >60 mL/min   GFR calc Af Amer >60 >60 mL/min    Comment: (NOTE) The eGFR has been calculated using the CKD EPI equation. This calculation has not been validated in all clinical situations. eGFR's persistently <60 mL/min signify possible Chronic Kidney Disease.    Anion gap 10 5 - 15  Ethanol (ETOH)     Status: None   Collection Time: 04/24/15  3:53 AM  Result Value Ref Range   Alcohol, Ethyl (B) <5 <5 mg/dL    Comment:        LOWEST DETECTABLE LIMIT FOR SERUM ALCOHOL IS 5 mg/dL FOR MEDICAL PURPOSES ONLY   Salicylate level     Status: None   Collection Time: 04/24/15  3:53 AM  Result Value Ref Range   Salicylate Lvl <5.0 2.8 - 30.0 mg/dL  Acetaminophen level     Status: Abnormal   Collection Time: 04/24/15  3:53 AM  Result Value Ref Range   Acetaminophen (Tylenol), Serum <10 (L) 10 - 30 ug/mL    Comment:        THERAPEUTIC CONCENTRATIONS VARY SIGNIFICANTLY. A RANGE OF 10-30 ug/mL MAY BE AN EFFECTIVE CONCENTRATION FOR MANY PATIENTS. HOWEVER, SOME ARE BEST TREATED AT CONCENTRATIONS OUTSIDE  THIS RANGE. ACETAMINOPHEN CONCENTRATIONS >150 ug/mL AT 4 HOURS AFTER INGESTION AND >50 ug/mL AT 12 HOURS AFTER INGESTION ARE OFTEN ASSOCIATED WITH TOXIC REACTIONS.   CBC     Status: None   Collection Time: 04/24/15  3:53 AM  Result Value Ref Range   WBC 8.8 4.0 - 10.5 K/uL   RBC 4.51 3.87 - 5.11 MIL/uL   Hemoglobin 13.8 12.0 - 15.0 g/dL   HCT 40.0 36.0 - 46.0 %   MCV 88.7 78.0 - 100.0 fL   MCH 30.6 26.0 - 34.0 pg   MCHC 34.5 30.0 - 36.0 g/dL   RDW 13.6 11.5 - 15.5 %   Platelets 286 150 - 400 K/uL  Urine rapid drug screen (hosp performed) (Not at [facility])     Status: None   Collection Time: 04/24/15  3:54 AM  Result Value Ref Range   Opiates NONE DETECTED NONE DETECTED   Cocaine NONE DETECTED NONE DETECTED   Benzodiazepines NONE DETECTED NONE DETECTED   Amphetamines NONE DETECTED NONE DETECTED   Tetrahydrocannabinol NONE DETECTED NONE DETECTED   Barbiturates NONE DETECTED NONE DETECTED    Comment:        DRUG SCREEN FOR MEDICAL PURPOSES ONLY.  IF CONFIRMATION IS NEEDED FOR ANY PURPOSE, NOTIFY LAB WITHIN 5 DAYS.        LOWEST DETECTABLE LIMITS FOR URINE DRUG SCREEN Drug Class       Cutoff (ng/mL) Amphetamine      1000 Barbiturate      200 Benzodiazepine   638 Tricyclics       937 Opiates          300 Cocaine          300 THC              50   Basic metabolic panel     Status: Abnormal   Collection Time: 04/24/15  6:44 AM  Result Value Ref Range   Sodium 137 135 - 145 mmol/L   Potassium 4.1 3.5 - 5.1 mmol/L    Comment: DELTA CHECK NOTED REPEATED TO VERIFY    Chloride 104 101 - 111 mmol/L   CO2 25 22 - 32 mmol/L   Glucose, Bld 104 (H) 65 - 99 mg/dL   BUN 15 6 - 20 mg/dL   Creatinine, Ser 0.80 0.44 - 1.00 mg/dL   Calcium 10.5 (H) 8.9 - 10.3 mg/dL    Comment: DELTA CHECK NOTED REPEATED TO VERIFY    GFR calc non Af Amer >60 >60 mL/min   GFR calc Af Amer >60 >60 mL/min    Comment: (NOTE) The eGFR has been calculated using the CKD EPI equation. This calculation  has not been validated in all clinical situations. eGFR's persistently <60 mL/min signify possible Chronic Kidney Disease.    Anion gap 8 5 - 15    Current Facility-Administered Medications  Medication Dose Route Frequency Provider Last Rate Last Dose  . calcium-vitamin D (OSCAL WITH D) 500-200 MG-UNIT per tablet 2 tablet  2 tablet Oral Daily Glendell Docker, NP   2 tablet at 04/24/15 1204  . cholecalciferol (VITAMIN D) tablet 7,000 Units  7,000 Units Oral Daily Glendell Docker, NP   7,000 Units at 04/24/15 1204  . [START ON 04/25/2015] DULoxetine (CYMBALTA) DR capsule 60 mg  60 mg Oral Daily Micholas Drumwright, MD      . hydrOXYzine (ATARAX/VISTARIL) tablet 25 mg  25 mg Oral BID PC Janari Gagner, MD      . ibuprofen (ADVIL,MOTRIN) tablet 400 mg  400 mg Oral Q6H PRN Serene Kopf, MD      . LORazepam (ATIVAN) tablet 1 mg  1 mg Oral Q6H PRN Corena Pilgrim, MD      . multivitamin with minerals tablet  1 tablet  1 tablet Oral Daily Glendell Docker, NP   1 tablet at 04/24/15 1207  . vitamin B-12 (CYANOCOBALAMIN) tablet 1,000 mcg  1,000 mcg Oral Daily Glendell Docker, NP   1,000 mcg at 04/24/15 1204   Current Outpatient Prescriptions  Medication Sig Dispense Refill  . Biotin 1000 MCG tablet Take 1,000 mcg by mouth daily.    . Calcium Carbonate-Vitamin D (CALCIUM-VITAMIN D3 PO) Take 1,200 mg by mouth daily. 1251m Ca and 1000units of vit D    . DULoxetine (CYMBALTA) 20 MG capsule Take 2 capsules (40 mg total) by mouth daily. (Patient taking differently: Take 20 mg by mouth 2 (two) times daily. ) 15 capsule 0  . Multiple Vitamins-Minerals (CENTRUM SILVER PO) Take 1 tablet by mouth daily. Reported on 02/18/2015    . Multiple Vitamins-Minerals (EYE VITAMINS PO) Take 1 tablet by mouth 2 (two) times daily. 1 in morning and 1 at night    . traMADol (ULTRAM) 50 MG tablet Take 50 mg by mouth every 6 (six) hours as needed for moderate pain or severe pain.     . vitamin B-12 (CYANOCOBALAMIN) 1000 MCG  tablet Take 1,000 mcg by mouth daily.    .Marland KitchenVITAMIN D, CHOLECALCIFEROL, PO Take 7,000 Units by mouth daily.     . [DISCONTINUED] venlafaxine (EFFEXOR) 37.5 MG tablet Take 37.5 mg by mouth at bedtime. Reported on 02/18/2015      Musculoskeletal: Strength & Muscle Tone: within normal limits Gait & Station: normal Patient leans: N/A  Psychiatric Specialty Exam: Review of Systems  Constitutional: Negative.   HENT: Negative.   Respiratory: Negative.   Gastrointestinal: Negative.   Genitourinary: Negative.   Musculoskeletal:       Chronic back pain  Skin: Negative.   Endo/Heme/Allergies: Negative.     Blood pressure 124/63, pulse 77, temperature 98.2 F (36.8 C), temperature source Oral, resp. rate 16, height 5' 10"  (1.778 m), weight 90.719 kg (200 lb), SpO2 95 %.Body mass index is 28.7 kg/(m^2).  General Appearance: Casual and Fairly Groomed  Eye Contact::  Good  Speech:  Clear and Coherent and Normal Rate  Volume:  Normal  Mood:  Anxious and Depressed  Affect:  Congruent, Depressed and Tearful  Thought Process:  Coherent, Goal Directed and Intact  Orientation:  Full (Time, Place, and Person)  Thought Content:  WDL  Suicidal Thoughts:  No  Homicidal Thoughts:  No  Memory:  Immediate;   Good Recent;   Good Remote;   Good  Judgement:  Fair  Insight:  Good  Psychomotor Activity:  Psychomotor Retardation  Concentration:  Fair  Recall:  [place] Knowledge:Fair  Language: Good  Akathisia:  No  Handed:  Right  AIMS (if indicated):     Assets:  Desire for Improvement  ADL's:  Intact  Cognition: WNL  Sleep:      Treatment Plan Summary: Daily contact with patient to assess and evaluate symptoms and progress in treatment and Medication management  Disposition:  Accepted for admission and she has a bed assigned..  We have resumed her home medications.  JDelfin Gant NP    PMHNP-BC 04/24/2015 2:27 PM Patient seen face-to-face for psychiatric evaluation, chart  reviewed and case discussed with the physician extender and developed treatment plan. Reviewed the information documented and agree with the treatment plan. MCorena Pilgrim MD

## 2015-04-24 NOTE — ED Notes (Signed)
Psychiatrist at bedside

## 2015-04-24 NOTE — ED Notes (Signed)
She is being seen by our T.T.S. Evaluator, Tom at this time.

## 2015-04-24 NOTE — ED Notes (Signed)
Pt transported to [facility] by [place] transportation. Pt took brassiere in bag and paperwork was given to [employer] driver.

## 2015-04-24 NOTE — ED Notes (Signed)
[facility] called for transportation.  

## 2015-04-24 NOTE — ED Notes (Signed)
Patient spoke with niece Joseph Art.

## 2015-04-24 NOTE — ED Notes (Signed)
Patient's niece Joseph Art) called, patient currently resting. Niece states she will call later.

## 2015-04-24 NOTE — ED Notes (Signed)
Patient is alert and oriented x4.  She is complaining of anxiety that presents as pain in her back. Patient was last seen at [facility] and wanted to be admitted to the behavioral unit but  Her medication was changed and she was DC.  Patient states that her anxiety has progressively  Gotten worse over the last 4 weeks.

## 2015-04-25 ENCOUNTER — Encounter (HOSPITAL_COMMUNITY): Payer: Self-pay | Admitting: Psychiatry

## 2015-04-25 DIAGNOSIS — F321 Major depressive disorder, single episode, moderate: Secondary | ICD-10-CM

## 2015-04-25 MED ORDER — LOPERAMIDE HCL 2 MG PO CAPS
2.0000 mg | ORAL_CAPSULE | Freq: Four times a day (QID) | ORAL | Status: DC | PRN
  Administered 2015-04-25 – 2015-04-28 (×2): 2 mg via ORAL
  Filled 2015-04-25 (×2): qty 1

## 2015-04-25 NOTE — BHH Suicide Risk Assessment (Signed)
[facility] Admission Suicide Risk Assessment   Nursing information obtained from:  Patient Demographic factors:  Age 70 or older, Caucasian Current Mental Status:  NA Loss Factors:  Decrease in vocational status, Decline in physical health Historical Factors:  NA Risk Reduction Factors:  Living with another person, especially a relative, Positive social support  Total Time spent with patient: 45 minutes Principal Problem: <principal problem not specified> Diagnosis:   Patient Active Problem List   Diagnosis Date Noted  . Generalized anxiety disorder [F41.1] 04/24/2015  . Major depression, single episode [F32.9] 04/24/2015  . Anxiety [F41.9]   . Depression [F32.9] 03/19/2015  . Anxiety state [F41.1] 03/19/2015  . Insomnia [G47.00] 03/19/2015  . Dizziness [R42] 03/19/2015  . Transient alteration of awareness [R40.4] 03/19/2015  . Tremor [R25.1] 06/05/2014  . Memory loss [R41.3] 06/05/2014  . Cancer ([place]) [C80.1]   . Malignant neoplasm of upper-inner quadrant of female breast ([place]) [C50.219]    Subjective Data: 70 Y/O female who endorsed increased depression and anxiety since her sister died of ALS a year ago. During the last months she has gradually developed  pain in a defined area in her back. There is a "burning component that recently has gotten worst after she takes Cymbalta. The pain starts in the morning when she wakes up and gets better in the afternoon. The family reported that she ruminates that she is "obsessed about this pain and her dying" having a hard time staying alone  Continued Clinical Symptoms:  Alcohol Use Disorder Identification Test Final Score (AUDIT): 0 The "Alcohol Use Disorders Identification Test", Guidelines for Use in Primary Care, Second Edition.  World Pharmacologist [facility]). Score between 0-7:  no or low risk or alcohol related problems. Score between 8-15:  moderate risk of alcohol related problems. Score between 16-19:  high risk of alcohol related  problems. Score 20 or above:  warrants further diagnostic evaluation for alcohol dependence and treatment.   CLINICAL FACTORS:   Severe Anxiety and/or Agitation Depression:   Severe Chronic Pain   Musculoskeletal: Strength & Muscle Tone: within normal limits Gait & Station: normal Patient leans: normal  Psychiatric Specialty Exam: Review of Systems  Constitutional: Negative.   HENT: Negative.   Eyes: Negative.   Respiratory: Negative.   Cardiovascular: Negative.   Gastrointestinal: Negative.   Genitourinary: Negative.   Musculoskeletal: Positive for back pain.  Skin: Negative.   Neurological: Negative.   Endo/Heme/Allergies: Negative.   Psychiatric/Behavioral: Positive for depression. The patient is nervous/anxious.     Blood pressure 99/68, pulse 90, temperature 97.4 F (36.3 C), temperature source Oral, resp. rate 16, height 5\' 10"  (1.778 m), weight 87.544 kg (193 lb), SpO2 100 %.Body mass index is 27.69 kg/(m^2).  General Appearance: Fairly Groomed  Engineer, water::  Fair  Speech:  Clear and Coherent  Volume:  fluctuates  Mood:  Anxious, Depressed and Dysphoric  Affect:  anxious worried sad  Thought Process:  Coherent and Goal Directed  Orientation:  Full (Time, Place, and Person)  Thought Content:  symptms events worries concerns somatic ruminations  Suicidal Thoughts:  No  Homicidal Thoughts:  No  Memory:  Immediate;   Fair Recent;   Fair Remote;   Fair  Judgement:  Fair  Insight:  Shallow  Psychomotor Activity:  Restlessness  Concentration:  Fair  Recall:  [employer] of Knowledge:Fair  Language: Fair  Akathisia:  No  Handed:  Right  AIMS (if indicated):     Assets:  Desire for [place]  Sleep:     Cognition: WNL  ADL's:  Intact    COGNITIVE FEATURES THAT CONTRIBUTE TO RISK:  Closed-mindedness, Polarized thinking and Thought constriction (tunnel vision)    SUICIDE RISK:   Mild:  Suicidal ideation of limited frequency,  intensity, duration, and specificity.  There are no identifiable plans, no associated intent, mild dysphoria and related symptoms, good self-control (both objective and subjective assessment), few other risk factors, and identifiable protective factors, including available and accessible social support.  PLAN OF CARE: Supportive approach/coping skills                              Depression; will reassess for the use of an antidepressant. She is concerned                              about Cymbalta. Will considered weaning off the Cymbalta. Given the "neuropathic" nature of her pain and that she was not able to tolerate gabapentin will consider small dose of Lyrica  Will check labs: TSH Hemoglobin A1C SED Rate RA ANA anti SDNA Vit D  I certify that inpatient services furnished can reasonably be expected to improve the patient's condition.   Nicholaus Bloom, MD 04/25/2015, 5:12 PM
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
## 2015-04-25 NOTE — Progress Notes (Signed)
Adult Psychoeducational Group Note  Date:  04/25/2015 Time:  0845  Group Topic/Focus:  Orientation:   The focus of this group is to educate the patient on the purpose and policies of crisis stabilization and provide a format to answer questions about their admission.  The group details unit policies and expectations of patients while admitted.  Participation Level:  Did Not Attend   Marissa Calamity 04/25/2015, 4:41 PM

## 2015-04-25 NOTE — Progress Notes (Signed)
D: Writer sat down with the pt who immediately told the writer " I didn't want to come here. People ask me about anxiety, I don't know what it is".  Then stated,"I don't think I have it". Pt began discussing how her back gets "hot and soaking wet, then moved on to asking for a sleep med.   A:  Support and encouragement was offered. 15 min checks continued for safety.  R: Pt remains safe.

## 2015-04-25 NOTE — BHH Group Notes (Signed)
[facility] Mental Health Association Group Therapy 04/25/2015 1:15pm  Type of Therapy: Mental Health Association Presentation  Participation Level: Active  Participation Quality: Attentive  Affect: Appropriate  Cognitive: Oriented  Insight: Developing/Improving  Engagement in Therapy: Engaged  Modes of Intervention: Discussion, Education and Socialization  Summary of Progress/Problems: Mental Health Association ([place]) Speaker came to talk about his personal journey with substance abuse and addiction. The pt processed ways by which to relate to the speaker. [place] speaker provided handouts and educational information pertaining to groups and services offered by the [facility]. Pt was engaged in speaker's presentation and was receptive to resources provided.    Peri Maris, [place] 04/25/2015 3:30 PM

## 2015-04-25 NOTE — BHH Suicide Risk Assessment (Signed)
[place] INPATIENT:  Family/Significant Other Suicide Prevention Education  Suicide Prevention Education:  Education Completed; Katherine Frye, Pt's daughter [phone],  has been identified by the patient as the family member/significant other with whom the patient will be residing, and identified as the person(s) who will aid the patient in the event of a mental health crisis (suicidal ideations/suicide attempt).  With written consent from the patient, the family member/significant other has been provided the following suicide prevention education, prior to the and/or following the discharge of the patient.  The suicide prevention education provided includes the following:  Suicide risk factors  Suicide prevention and interventions  National Suicide Hotline telephone number  [facility] assessment telephone number  [facility] Emergency Assistance [place] and/or Residential Mobile Crisis Unit telephone number  Request made of family/significant other to:  Remove weapons (e.g., guns, rifles, knives), all items previously/currently identified as safety concern.    Remove drugs/medications (over-the-counter, prescriptions, illicit drugs), all items previously/currently identified as a safety concern.  The family member/significant other verbalizes understanding of the suicide prevention education information provided.  The family member/significant other agrees to remove the items of safety concern listed above.  CSW spoke with Pt's daughter who is very concerned about Pt's condition. Per Katherine Frye, since December, Pt's personality and behaviors have changed starkly. Katherine Frye expressed that Pt used to be very positive and outgoing, well-groomed, and rarely worried or anxious. However, since December, Pt has become overly anxious and focused on the "burning sensation" in her back and arms. Katherine Frye reports that Pt has been seen by multiple specialists who have found no physical  etiological cause for this burning. The burning started in December; Pt began taking Cymbalta in Jan/Feb, however she still attributes this to the Cymbalta. Pt's sister died of Leverne Humbles Disease a year and a half ago; Pt has hx of breast cancer with double mastectomy. Katherine Frye also reports that for the past 2 years, Pt has had significant tremors that have worsened over the last 2-3 months.   Bo Mcclintock 04/25/2015, 11:19 AM

## 2015-04-25 NOTE — H&P (Signed)
Psychiatric Admission Assessment Adult  Patient Identification: Katherine Frye MRN:  [id number]  Date of Evaluation:  04/25/2015  Chief Complaint:  MDD SINGLE EPISODE MODERATE  Principal Diagnosis: Major depression, single episode  Diagnosis:   Patient Active Problem List   Diagnosis Date Noted  . Generalized anxiety disorder [F41.1] 04/24/2015  . Major depression, single episode [F32.9] 04/24/2015  . Anxiety [F41.9]   . Depression [F32.9] 03/19/2015  . Anxiety state [F41.1] 03/19/2015  . Insomnia [G47.00] 03/19/2015  . Dizziness [R42] 03/19/2015  . Transient alteration of awareness [R40.4] 03/19/2015  . Tremor [R25.1] 06/05/2014  . Memory loss [R41.3] 06/05/2014  . Cancer ([place]) [C80.1]   . Malignant neoplasm of upper-inner quadrant of female breast ([place]) [C50.219]    History of Present Illness: This is a 70 year old Caucasian female. Admitted to [facility] from the [facility] with complaints of worsening symptoms of depression & anxiety. During this admission assessment, Katherine Frye reports, "I have been getting this medication for depression for about 2 months now. I have not seen anyone with anxiety to know how it feels. I never drank or use dugs. Never smoked either. I can say that I have been depressed x 1 year since the death of my sister, She died of Lou Garig's disease. She was my closest sibling. I felt lost since her death. Then, I developed this burning pain to my back. I have seen my primary doctor & also a neurologist. They have done all kinds of test with no definite answer for me. My family & friends thinks I'm depressed. I can't cope with this burning pain to my back. The pain starts in the morning, will not not cease until about 12:00 PM. When this pain starts, I'm unable to do anything other than lying in bed till it clears. My doctor has decided to put me on Cymbalta. But, when I take the Cymbalta, about 20 minutes after, the burning pain will worsen then clears eventually.  I had tried Neurontin, it made me feel dizzy, I fell off the bed after I took it. Do you have an answer for me about what has been going on with me?  Associated Signs/Symptoms:  Depression Symptoms:  depressed mood, insomnia, difficulty concentrating, anxiety,  (Hypo) Manic Symptoms:  Denies any hypomanic symptoms or hx  Anxiety Symptoms:  Excessive Worry,  Psychotic Symptoms:  Denies any delusional symptoms  PTSD Symptoms: Denies  Total Time spent with patient: 1 hour  Past Psychiatric History: Anxiety disorder  Is the patient at risk to self? No.  Has the patient been a risk to self in the past 6 months? No.  Has the patient been a risk to self within the distant past? No.  Is the patient a risk to others? No.  Has the patient been a risk to others in the past 6 months? No.  Has the patient been a risk to others within the distant past? No.   Prior Inpatient Therapy: Patient denies Prior Outpatient Therapy: Yes (With Dr. Darnell Level. In [place], [place])  Alcohol Screening: 1. How often do you have a drink containing alcohol?: Never 9. Have you or someone else been injured as a result of your drinking?: No 10. Has a relative or friend or a doctor or another health worker been concerned about your drinking or suggested you cut down?: No Alcohol Use Disorder Identification Test Final Score (AUDIT): 0 Brief Intervention: AUDIT score less than 7 or less-screening does not suggest unhealthy drinking-brief intervention not indicated  Substance Abuse History in the last 12 months:  No.  Consequences of Substance Abuse: Does not use or abuse substances  Previous Psychotropic Medications: Yes (Cymbalta)  Psychological Evaluations: Yes   Past Medical History:  Past Medical History  Diagnosis Date  . Allergy 2010    medication and seasonal  . Special screening for malignant neoplasms, colon 2013  . Obesity, unspecified 2013  . Cancer [facility]) 2010    right breast  . Malignant neoplasm of  upper-inner quadrant of female breast ([place]) 2010  . Solitary cyst of breast 2012    left    Past Surgical History  Procedure Laterality Date  . Colonoscopy  2010    Dr. Lyndel Safe, [place]  . Upper gi endoscopy  2010  . Foot surgery  1996  . Tonsillectomy    . Appendectomy    . Nasal sinus surgery  1991  . Skin cancer excision  2013    basel cell on forehead  . Knee surgery Right 2013    replacement  . Knee arthroscopy Right 2012    May and October  . Tubal ligation    . Breast surgery Right 2010    T2, N0, ER/PR positive, HER-2/neu negative, Oncotype recurrence score  of 10 ( 7% risk of recurrence)   . Breast cyst excision Right 2006    fibroid cyst  . Breast biopsy Right 2010   Family History:  Family History  Problem Relation Age of Onset  . Lymphoma Sister     non-hodgkin  . Cancer Sister     cancer of thyroid  . Cancer Other     family history of colon breast and ovarian cancer, no relationships listed   Family Psychiatric  History: Anxiety disorder: Siblings  Tobacco Screening: Patient denies smoking cigarettes or use of tobacco product  History:  History  Alcohol Use No     History  Drug Use No    Additional Social History: Marital status: Married Number of Years Married: 50 What types of issues is patient dealing with in the relationship?: husband is sad that Pt is struggling; have a good relationship What is your sexual orientation?: heterosexual Does patient have children?: Yes How many children?: 2 How is patient's relationship with their children?: good relationships with daughters   Allergies:   Allergies  Allergen Reactions  . Levaquin [Levofloxacin In D5w] Nausea And Vomiting    Also dizziness and headaches   Lab Results:  Results for orders placed or performed during the hospital encounter of 04/24/15 (from the past 48 hour(s))  Comprehensive metabolic panel     Status: Abnormal   Collection Time: 04/24/15  3:53 AM  Result Value Ref  Range   Sodium 135 135 - 145 mmol/L   Potassium 6.4 (HH) 3.5 - 5.1 mmol/L    Comment: RESULT REPEATED AND VERIFIED NO VISIBLE HEMOLYSIS CRITICAL RESULT CALLED TO, READ BACK BY AND VERIFIED WITH: T Hassell Done RN @ 0451 ON 04/24/15 BY C DAVIS    Chloride 99 (L) 101 - 111 mmol/L   CO2 26 22 - 32 mmol/L   Glucose, Bld 100 (H) 65 - 99 mg/dL   BUN 16 6 - 20 mg/dL   Creatinine, Ser 0.78 0.44 - 1.00 mg/dL   Calcium 6.9 (L) 8.9 - 10.3 mg/dL   Total Protein 7.5 6.5 - 8.1 g/dL   Albumin 4.7 3.5 - 5.0 g/dL   AST 21 15 - 41 U/L   ALT 9 (L) 14 - 54 U/L  Alkaline Phosphatase 80 38 - 126 U/L   Total Bilirubin 0.7 0.3 - 1.2 mg/dL   GFR calc non Af Amer >60 >60 mL/min   GFR calc Af Amer >60 >60 mL/min    Comment: (NOTE) The eGFR has been calculated using the CKD EPI equation. This calculation has not been validated in all clinical situations. eGFR's persistently <60 mL/min signify possible Chronic Kidney Disease.    Anion gap 10 5 - 15  Ethanol (ETOH)     Status: None   Collection Time: 04/24/15  3:53 AM  Result Value Ref Range   Alcohol, Ethyl (B) <5 <5 mg/dL    Comment:        LOWEST DETECTABLE LIMIT FOR SERUM ALCOHOL IS 5 mg/dL FOR MEDICAL PURPOSES ONLY   Salicylate level     Status: None   Collection Time: 04/24/15  3:53 AM  Result Value Ref Range   Salicylate Lvl <6.2 2.8 - 30.0 mg/dL  Acetaminophen level     Status: Abnormal   Collection Time: 04/24/15  3:53 AM  Result Value Ref Range   Acetaminophen (Tylenol), Serum <10 (L) 10 - 30 ug/mL    Comment:        THERAPEUTIC CONCENTRATIONS VARY SIGNIFICANTLY. A RANGE OF 10-30 ug/mL MAY BE AN EFFECTIVE CONCENTRATION FOR MANY PATIENTS. HOWEVER, SOME ARE BEST TREATED AT CONCENTRATIONS OUTSIDE THIS RANGE. ACETAMINOPHEN CONCENTRATIONS >150 ug/mL AT 4 HOURS AFTER INGESTION AND >50 ug/mL AT 12 HOURS AFTER INGESTION ARE OFTEN ASSOCIATED WITH TOXIC REACTIONS.   CBC     Status: None   Collection Time: 04/24/15  3:53 AM  Result Value  Ref Range   WBC 8.8 4.0 - 10.5 K/uL   RBC 4.51 3.87 - 5.11 MIL/uL   Hemoglobin 13.8 12.0 - 15.0 g/dL   HCT 40.0 36.0 - 46.0 %   MCV 88.7 78.0 - 100.0 fL   MCH 30.6 26.0 - 34.0 pg   MCHC 34.5 30.0 - 36.0 g/dL   RDW 13.6 11.5 - 15.5 %   Platelets 286 150 - 400 K/uL  Urine rapid drug screen (hosp performed) (Not at [facility])     Status: None   Collection Time: 04/24/15  3:54 AM  Result Value Ref Range   Opiates NONE DETECTED NONE DETECTED   Cocaine NONE DETECTED NONE DETECTED   Benzodiazepines NONE DETECTED NONE DETECTED   Amphetamines NONE DETECTED NONE DETECTED   Tetrahydrocannabinol NONE DETECTED NONE DETECTED   Barbiturates NONE DETECTED NONE DETECTED    Comment:        DRUG SCREEN FOR MEDICAL PURPOSES ONLY.  IF CONFIRMATION IS NEEDED FOR ANY PURPOSE, NOTIFY LAB WITHIN 5 DAYS.        LOWEST DETECTABLE LIMITS FOR URINE DRUG SCREEN Drug Class       Cutoff (ng/mL) Amphetamine      1000 Barbiturate      200 Benzodiazepine   703 Tricyclics       500 Opiates          300 Cocaine          300 THC              50   Basic metabolic panel     Status: Abnormal   Collection Time: 04/24/15  6:44 AM  Result Value Ref Range   Sodium 137 135 - 145 mmol/L   Potassium 4.1 3.5 - 5.1 mmol/L    Comment: DELTA CHECK NOTED REPEATED TO VERIFY    Chloride 104 101 - 111 mmol/L  CO2 25 22 - 32 mmol/L   Glucose, Bld 104 (H) 65 - 99 mg/dL   BUN 15 6 - 20 mg/dL   Creatinine, Ser 0.80 0.44 - 1.00 mg/dL   Calcium 10.5 (H) 8.9 - 10.3 mg/dL    Comment: DELTA CHECK NOTED REPEATED TO VERIFY    GFR calc non Af Amer >60 >60 mL/min   GFR calc Af Amer >60 >60 mL/min    Comment: (NOTE) The eGFR has been calculated using the CKD EPI equation. This calculation has not been validated in all clinical situations. eGFR's persistently <60 mL/min signify possible Chronic Kidney Disease.    Anion gap 8 5 - 15   Blood Alcohol level:  Lab Results  Component Value Date   ETH <5 47/07/6149   Metabolic  Disorder Labs:  No results found for: HGBA1C, MPG No results found for: PROLACTIN No results found for: CHOL, TRIG, HDL, CHOLHDL, VLDL, LDLCALC  Current Medications: Current Facility-Administered Medications  Medication Dose Route Frequency Provider Last Rate Last Dose  . calcium-vitamin D (OSCAL WITH D) 500-200 MG-UNIT per tablet 2 tablet  2 tablet Oral Daily Delfin Gant, NP   2 tablet at 04/25/15 0834  . cholecalciferol (VITAMIN D) tablet 7,000 Units  7,000 Units Oral Daily Delfin Gant, NP   7,000 Units at 04/25/15 [phone]  . DULoxetine (CYMBALTA) DR capsule 60 mg  60 mg Oral Daily Delfin Gant, NP   60 mg at 04/25/15 0834  . hydrOXYzine (ATARAX/VISTARIL) tablet 25 mg  25 mg Oral BID PC Delfin Gant, NP   25 mg at 04/25/15 0834  . ibuprofen (ADVIL,MOTRIN) tablet 400 mg  400 mg Oral Q6H PRN Delfin Gant, NP   400 mg at 04/25/15 7357  . LORazepam (ATIVAN) tablet 1 mg  1 mg Oral Q6H PRN Delfin Gant, NP      . multivitamin with minerals tablet 1 tablet  1 tablet Oral Daily Delfin Gant, NP   1 tablet at 04/25/15 0834  . vitamin B-12 (CYANOCOBALAMIN) tablet 1,000 mcg  1,000 mcg Oral Daily Delfin Gant, NP   1,000 mcg at 04/25/15 8978   PTA Medications: Prescriptions prior to admission  Medication Sig Dispense Refill Last Dose  . Biotin 1000 MCG tablet Take 1,000 mcg by mouth daily.   04/23/2015  . Calcium Carbonate-Vitamin D (CALCIUM-VITAMIN D3 PO) Take 1,200 mg by mouth daily. 1268m Ca and 1000units of vit D   04/23/2015  . DULoxetine (CYMBALTA) 20 MG capsule Take 20 mg by mouth 2 (two) times daily.   04/24/2015 at Unknown time  . Multiple Vitamins-Minerals (CENTRUM SILVER PO) Take 1 tablet by mouth daily. Reported on 02/18/2015   04/23/2015  . vitamin B-12 (CYANOCOBALAMIN) 1000 MCG tablet Take 1,000 mcg by mouth daily.   04/23/2015  . VITAMIN D, CHOLECALCIFEROL, PO Take 7,000 Units by mouth daily.    04/23/2015   Musculoskeletal: Strength & Muscle  Tone: within normal limits Gait & Station: normal Patient leans: N/A  Psychiatric Specialty Exam: Physical Exam  Constitutional: She is oriented to person, place, and time. She appears well-developed.  HENT:  Head: Normocephalic.  Eyes: Pupils are equal, round, and reactive to light.  Neck: Normal range of motion.  Cardiovascular: Normal rate.   Respiratory: Effort normal.  GI: Soft.  Genitourinary:  Denies any issues in this area  Musculoskeletal: Normal range of motion.  Neurological: She is alert and oriented to person, place, and time.  Skin: Skin is warm  and dry.  Psychiatric: Her speech is normal and behavior is normal. Judgment and thought content normal. Her mood appears anxious. Her affect is not angry, not blunt, not labile and not inappropriate. Cognition and memory are normal. She exhibits a depressed mood.    Review of Systems  Constitutional: Negative.   HENT: Negative.   Eyes: Negative.   Respiratory: Negative.   Cardiovascular: Negative.   Gastrointestinal: Negative.   Genitourinary: Negative.   Musculoskeletal: Positive for myalgias.  Skin: Negative.   Neurological: Positive for tremors (Essential, not from alcoholism).  Endo/Heme/Allergies: Negative.   Psychiatric/Behavioral: Positive for depression. Negative for suicidal ideas, hallucinations, memory loss and substance abuse. The patient is nervous/anxious and has insomnia.     Blood pressure 99/68, pulse 90, temperature 97.4 F (36.3 C), temperature source Oral, resp. rate 16, height 5' 10"  (1.778 m), weight 87.544 kg (193 lb), SpO2 100 %.Body mass index is 27.69 kg/(m^2).  General Appearance: Disheveled  Eye Contact::  Good  Speech:  Clear and Coherent  Volume:  Normal  Mood:  Depressed, anxious, worries  Affect:  Restricted, worried  Thought Process:  Coherent, Goal Directed and Intact  Orientation:  Full (Time, Place, and Person)  Thought Content:  Rumination, denies any hallucinationsor Paranoia   Suicidal Thoughts:  Denies  Homicidal Thoughts:  Denies  Memory:  Immediate;   Good Recent;   Good Remote;   Good  Judgement:  Fair  Insight:  Fair  Psychomotor Activity:  Normal  Concentration:  Fair  Recall:  Good  Fund of Knowledge:Fair  Language: Good  Akathisia:  Negative  Handed:  Right  AIMS (if indicated):     Assets:  Communication Skills Desire for Improvement  ADL's:  Intact  Cognition: WNL  Sleep:      Treatment Plan/Recommendations: 1. Admit for crisis management and stabilization, estimated length of stay 3-5 days.  2. Medication management to reduce current symptoms to base line and improve the patient's overall level of functioning;Cymbalta DR 60 mg for depression/pain, Lorazepam 1 mg for anxiety, Hydroxyzine 25 mg prn for anxiety,   3. Treat health problems as indicated;  4. Develop treatment plan to decrease risk of relapse upon discharge and the need for readmission.  5. Psycho-social education regarding relapse prevention and self care.  6. Health care follow up as needed for medical problems.  7. Review, reconcile, and reinstate any pertinent home medications for other health issues where appropriate. 8. Call for consults with hospitalist for any additional specialty patient care services as needed.  Observation Level/Precautions:  15 minute checks  Laboratory:  Per ED  Psychotherapy: Group sessions   Medications: Cymbalta DR 60 mg for depression/pain, Lorazepam 1 mg for anxiety, Hydroxyzine 25 mg prn for anxiety, Vitamin -D 7,000 units for bone health, Vitamin-B 12 1,000 mcg for B-12 deficiency.  Consultations: As needed.    Discharge Concerns: Mood stability    Estimated LOS: 3-5 days  Other: Admit to [facility].    I certify that inpatient services furnished can reasonably be expected to improve the patient's condition.    Encarnacion Slates, NP, PMHNP-BC 4/20/201712:59 PM I personally assessed the patient, reviewed the physical exam and labs and  formulated the treatment plan Geralyn Flash A. Sabra Heck, M.D.

## 2015-04-25 NOTE — Progress Notes (Signed)
[facility] Group Notes:  (Nursing/MHT/Case Management/Adjunct)  Date:  04/25/2015  Time:  2100  Type of Therapy:  wrap up group  Participation Level:  Active  Participation Quality:  Appropriate, Attentive, Sharing and Supportive  Affect:  Flat  Cognitive:  Lacking  Insight:  Limited  Engagement in Group:  Engaged  Modes of Intervention:  Clarification, Education and Support  Summary of Progress/Problems:  Shellia Cleverly 04/25/2015, 10:37 PM

## 2015-04-25 NOTE — Tx Team (Signed)
Interdisciplinary Treatment Plan Update (Adult) Date: 04/25/2015   Date: 04/25/2015 8:56 AM  Progress in Treatment:  Attending groups: Pt is new to milieu, continuing to assess  Participating in groups: Pt is new to milieu, continuing to assess  Taking medication as prescribed: Yes  Tolerating medication: Yes  Family/Significant othe contact made: No, CSW assessing for appropriate contact Patient understands diagnosis: Continuing to assess Discussing patient identified problems/goals with staff: Yes  Medical problems stabilized or resolved: Yes  Denies suicidal/homicidal ideation: No, Pt admitted with passive SI Patient has not harmed self or Others: Yes   New problem(s) identified: None identified at this time.   Discharge Plan or Barriers: CSW will assess for appropriate discharge plan and relevant barriers.   Additional comments:  Patient and CSW reviewed pt's identified goals and treatment plan. Patient verbalized understanding and agreed to treatment plan. CSW reviewed [facility] "Discharge Process and Patient Involvement" Form. Pt verbalized understanding of information provided and signed form.   Reason for Continuation of Hospitalization:  Anxiety Depression Medication stabilization Suicidal ideation  Estimated length of stay: 3-5 days  Review of initial/current patient goals per problem list:   1.  Goal(s): Patient will participate in aftercare plan  Met:  No  Target date: 3-5 days from date of admission   As evidenced by: Patient will participate within aftercare plan AEB aftercare provider and housing plan at discharge being identified.  04/25/15: CSW to work with Pt to assess for appropriate discharge plan and faciliate appointments and referrals as needed prior to d/c.  2.  Goal (s): Patient will exhibit decreased depressive symptoms and suicidal ideations.  Met:  No  Target date: 3-5 days from date of admission   As evidenced by: Patient will utilize self rating of  depression at 3 or below and demonstrate decreased signs of depression or be deemed stable for discharge by MD. 04/25/15: Pt was admitted with symptoms of depression, rating 10/10. Pt continues to present with flat affect and depressive symptoms.  Pt will demonstrate decreased symptoms of depression and rate depression at 3/10 or lower prior to discharge.  3.  Goal(s): Patient will demonstrate decreased signs and symptoms of anxiety.  Met:  No  Target date: 3-5 days from date of admission   As evidenced by: Patient will utilize self rating of anxiety at 3 or below and demonstrated decreased signs of anxiety, or be deemed stable for discharge by MD 04/25/15: Pt was admitted with increased levels of anxiety and is currently rating those symptoms highly. Pt will demonstrated decreased symptoms of anxiety and rate it at 3/10 prior to d/c.  Attendees:  Patient:    Family:    Physician: Dr. Parke Poisson, MD  04/25/2015 8:56 AM  Nursing: Lars Pinks, RN Case manager  04/25/2015 8:56 AM  Clinical Social Worker Peri Maris, [place] 04/25/2015 8:56 AM  Other: Tilden Fossa, [place] 04/25/2015 8:56 AM  Clinical: Darrol Angel RN; Marcella Dubs, RN 04/25/2015 8:56 AM  Other: , RN Charge Nurse 04/25/2015 8:56 AM  Other: Hilda Lias, [place], [address] Social Work [phone]

## 2015-04-25 NOTE — Progress Notes (Signed)
D: Pt presents with flat affect and anxious mood. Pt reported feeling depressed due to her chronic back pain. Pt stated that she wants to come off of the Cymbalta because it makes her back burn. Pt then verbalized that she wants to be on the right medications so that she can feel better. Pt A & O x 4 but have some confusion related to ADL's. Pt thoughts are disorganized and tangential at times. Pt requires redirecting and reeducating during periods of confusion. Pt rates depression 5/10. Anxiety 4/10. Hopeless 4/10. Pt denies suicidal thoughts today.  A: Medications administered as ordered per MD. Verbal support provided. Pt encouraged to attend groups. 15 minute checks performed for safety. R: Pt stated goal "feeling better".

## 2015-04-26 LAB — SJOGRENS SYNDROME-B EXTRACTABLE NUCLEAR ANTIBODY

## 2015-04-26 LAB — TSH: TSH: 1.706 u[IU]/mL (ref 0.350–4.500)

## 2015-04-26 LAB — SEDIMENTATION RATE: SED RATE: 3 mm/h (ref 0–22)

## 2015-04-26 MED ORDER — MIRTAZAPINE 7.5 MG PO TABS
7.5000 mg | ORAL_TABLET | Freq: Every day | ORAL | Status: DC
  Administered 2015-04-26: 7.5 mg via ORAL
  Filled 2015-04-26 (×2): qty 1

## 2015-04-26 MED ORDER — DULOXETINE HCL 20 MG PO CPEP
40.0000 mg | ORAL_CAPSULE | Freq: Every day | ORAL | Status: DC
  Administered 2015-04-27: 40 mg via ORAL
  Filled 2015-04-26 (×2): qty 2

## 2015-04-26 NOTE — BHH Group Notes (Signed)
[facility] LCSW Group Therapy 04/26/2015 1:15pm  Type of Therapy: Group Therapy- Feelings Around Relapse and Recovery  Participation Level: Minimal  Participation Quality:  Appropriate  Affect:  Flat and Anxious  Cognitive: Alert and Oriented   Insight:  Limited  Engagement in Therapy: Developing/Improving and Engaged   Modes of Intervention: Clarification, Confrontation, Discussion, Education, Exploration, Limit-setting, Orientation, Problem-solving, Rapport Building, Art therapist, Socialization and Support  Summary of Progress/Problems: The topic for today was feelings about relapse. The group discussed what relapse prevention is to them and identified triggers that they are on the path to relapse. Members also processed their feeling towards relapse and were able to relate to common experiences. Group also discussed coping skills that can be used for relapse prevention.  Pt continues to be anxious and minimal in group discussion. She is focused on pain in her back and her confusion on why she cannot get better.   Therapeutic Modalities:   Cognitive Behavioral Therapy Solution-Focused Therapy Assertiveness Training Relapse Prevention Therapy    Norman Clay [id number] 04/26/2015 3:47 PM

## 2015-04-26 NOTE — Progress Notes (Signed)
D:Pt is out in the dayroom and has been in her bed most of the morning. Pt says that she has lower burning back pain of a 4 on 0-10 scale with 10 being the most. Pt has requested a bed away from the heat/air.  A:Bed assignment has been changed and pt was moved to a bed away from heat/air. Ibuprofen offered and pt says that she will wait until later.  R:Pt denies si and hi. Safety maintained on the unit.

## 2015-04-26 NOTE — Progress Notes (Signed)
[facility] MD Progress Note  04/26/2015 4:26 PM Katherine Frye  MRN:  [id number] Subjective:    Patient states "I am very anxious. My back has still been hurting and it's like nobody is hearing me. I took a motrin yesterday morning and it helped some. I just want to know what else can be done because this is really bothering me. I am not sleeping well either. I think the Cymbalta is making my pain worse. My MD only started that after he could not find anything medically wrong with me. It all started with a pain in my shoulder in December 2016. I was doing fine before that. I was happily decorating cakes and Christmas was approaching. I would say my mood was not depressed in any way."   Objective:   Katherine Frye is a 70 year old female voluntarily presenting as a walk-in to [facility] c/o increased anxiety, stress, confusion, and depression. Patient continues to be focused on medical/physical complaint of severe back pain. Her family had reported that patient had progressed to not wanting to be alone and has been obsessed with fears of dying. The patient insists that it is only a response to back pain that developed last year. Her medical MD has not been able to find a cause for her pain and eventually placed her on Cymbalta. Patient reports this has made her pain worse and is also not helping her mood. She reports taking it for two months. Katherine Frye is noted to be very anxious throughout the assessment today feeling that "I was written off by the MD. And there must be a reason for it." Patient responds to emotional support and encouragement. She wishes to be tapered off the Cymbalta and tried on a medication to help with sleep. According to family the patient has been experiencing depression since last year, especially since the death of her sister. The patient appears to have poor insight into her mood symptoms remaining fixated on somatic complaints. She declined to try Neurontin for anxiety/pain because "It made me fall  off the bed and get dizzy."   Principal Problem: Major depression, single episode Diagnosis:   Patient Active Problem List   Diagnosis Date Noted  . Generalized anxiety disorder [F41.1] 04/24/2015  . Major depression, single episode [F32.9] 04/24/2015  . Anxiety [F41.9]   . Depression [F32.9] 03/19/2015  . Anxiety state [F41.1] 03/19/2015  . Insomnia [G47.00] 03/19/2015  . Dizziness [R42] 03/19/2015  . Transient alteration of awareness [R40.4] 03/19/2015  . Tremor [R25.1] 06/05/2014  . Memory loss [R41.3] 06/05/2014  . Cancer ([place]) [C80.1]   . Malignant neoplasm of upper-inner quadrant of female breast ([place]) [C50.219]    Total Time spent with patient: 30 minutes  Past Psychiatric History: See H & P  Past Medical History:  Past Medical History  Diagnosis Date  . Allergy 2010    medication and seasonal  . Special screening for malignant neoplasms, colon 2013  . Obesity, unspecified 2013  . Cancer [facility]) 2010    right breast  . Malignant neoplasm of upper-inner quadrant of female breast ([place]) 2010  . Solitary cyst of breast 2012    left    Past Surgical History  Procedure Laterality Date  . Colonoscopy  2010    Dr. Lyndel Safe, [place]  . Upper gi endoscopy  2010  . Foot surgery  1996  . Tonsillectomy    . Appendectomy    . Nasal sinus surgery  1991  . Skin cancer excision  2013  basel cell on forehead  . Knee surgery Right 2013    replacement  . Knee arthroscopy Right 2012    May and October  . Tubal ligation    . Breast surgery Right 2010    T2, N0, ER/PR positive, HER-2/neu negative, Oncotype recurrence score  of 10 ( 7% risk of recurrence)   . Breast cyst excision Right 2006    fibroid cyst  . Breast biopsy Right 2010   Family History:  Family History  Problem Relation Age of Onset  . Lymphoma Sister     non-hodgkin  . Cancer Sister     cancer of thyroid  . Cancer Other     family history of colon breast and ovarian cancer, no relationships listed    Family Psychiatric  History: See H & P Social History:  History  Alcohol Use No     History  Drug Use No    Social History   Social History  . Marital Status: Married    Spouse Name: Murle Hellstrom "Vic"  . Number of Children: 2  . Years of Education: 12   Social History Main Topics  . Smoking status: Never Smoker   . Smokeless tobacco: Never Used  . Alcohol Use: No  . Drug Use: No  . Sexual Activity: Not Asked   Other Topics Concern  . None   Social History Narrative   Lives at home with husband.   Caffeine use: Drinks 1 cup coffee per day (sometimes 2 cups per day)      Additional Social History:                         Sleep: Poor  Appetite:  Fair  Current Medications: Current Facility-Administered Medications  Medication Dose Route Frequency Provider Last Rate Last Dose  . calcium-vitamin D (OSCAL WITH D) 500-200 MG-UNIT per tablet 2 tablet  2 tablet Oral Daily Delfin Gant, NP   2 tablet at 04/26/15 0915  . cholecalciferol (VITAMIN D) tablet 7,000 Units  7,000 Units Oral Daily Delfin Gant, NP   7,000 Units at 04/26/15 0916  . [START ON 04/27/2015] DULoxetine (CYMBALTA) DR capsule 40 mg  40 mg Oral Daily Niel Hummer, NP      . hydrOXYzine (ATARAX/VISTARIL) tablet 25 mg  25 mg Oral BID PC Delfin Gant, NP   25 mg at 04/26/15 0917  . ibuprofen (ADVIL,MOTRIN) tablet 400 mg  400 mg Oral Q6H PRN Delfin Gant, NP   400 mg at 04/26/15 0418  . loperamide (IMODIUM) capsule 2 mg  2 mg Oral Q6H PRN Encarnacion Slates, NP   2 mg at 04/25/15 1321  . LORazepam (ATIVAN) tablet 1 mg  1 mg Oral Q6H PRN Delfin Gant, NP      . mirtazapine (REMERON) tablet 7.5 mg  7.5 mg Oral QHS Niel Hummer, NP      . multivitamin with minerals tablet 1 tablet  1 tablet Oral Daily Delfin Gant, NP   1 tablet at 04/26/15 0917  . vitamin B-12 (CYANOCOBALAMIN) tablet 1,000 mcg  1,000 mcg Oral Daily Delfin Gant, NP   1,000 mcg at 04/26/15 9390     Lab Results:  Results for orders placed or performed during the hospital encounter of 04/24/15 (from the past 48 hour(s))  Sjogrens syndrome-B extractable nuclear antibody     Status: None   Collection Time: 04/25/15  6:30 PM  Result Value  Ref Range   SSB ([place]) (ENA) Antibody, IgG <0.2 0.0 - 0.9 AI    Comment: (NOTE) Performed At: [facility] [place], [place] [id number] Lindon Romp MD [id number] Performed at [facility]   Sedimentation rate     Status: None   Collection Time: 04/26/15  6:18 AM  Result Value Ref Range   Sed Rate 3 0 - 22 mm/hr    Comment: Performed at [facility]  TSH     Status: None   Collection Time: 04/26/15  6:18 AM  Result Value Ref Range   TSH 1.706 0.350 - 4.500 uIU/mL    Comment: Performed at [facility]    Blood Alcohol level:  Lab Results  Component Value Date   [facility] <5 04/24/2015    Physical Findings: AIMS: Facial and Oral Movements Muscles of Facial Expression: None, normal Lips and Perioral Area: None, normal Jaw: None, normal Tongue: None, normal,Extremity Movements Upper (arms, wrists, hands, fingers): None, normal Lower (legs, knees, ankles, toes): None, normal, Trunk Movements Neck, shoulders, hips: None, normal, Overall Severity Severity of abnormal movements (highest score from questions above): None, normal Incapacitation due to abnormal movements: None, normal Patient's awareness of abnormal movements (rate only patient's report): No Awareness, Dental Status Current problems with teeth and/or dentures?: No Does patient usually wear dentures?: No  CIWA:    COWS:     Musculoskeletal: Strength & Muscle Tone: within normal limits Gait & Station: normal Patient leans: N/A  Psychiatric Specialty Exam: Review of Systems  Musculoskeletal: Positive for myalgias and back pain.  Psychiatric/Behavioral: Positive for depression. Negative for  suicidal ideas, hallucinations, memory loss and substance abuse. The patient is nervous/anxious and has insomnia.     Blood pressure 99/68, pulse 90, temperature 97.4 F (36.3 C), temperature source Oral, resp. rate 16, height 5' 10"  (1.778 m), weight 87.544 kg (193 lb), SpO2 100 %.Body mass index is 27.69 kg/(m^2).  General Appearance: Casual  Eye Contact::  Good  Speech:  Clear and Coherent  Volume:  Normal  Mood:  Anxious  Affect:  Congruent  Thought Process:  Coherent, Goal Directed and Intact  Orientation:  Full (Time, Place, and Person)  Thought Content:  Rumination  Suicidal Thoughts:  No  Homicidal Thoughts:  No  Memory:  Immediate;   Good Recent;   Good Remote;   Good  Judgement:  Fair  Insight:  Fair  Psychomotor Activity:  Normal  Concentration:  Good  Recall:  Good  Fund of Knowledge:Fair  Language: Good  Akathisia:  Negative  Handed:  Right  AIMS (if indicated):     Assets:  Communication Skills Desire for Improvement Financial Resources/Insurance Leisure Time Physical Health Resilience Social Support Vocational/Educational  ADL's:  Intact  Cognition: WNL  Sleep:  Number of Hours: 4.75   Treatment Plan Summary: Daily contact with patient to assess and evaluate symptoms and progress in treatment and Medication management  -Decrease Cymbalta to 40 mg daily to slowly wean patient off (reporting side effects and feels medication is not effective) -Start Remeron 7.5 mg hs for insomnia and depressive symptoms -Continue motrin 400 mg every six hours as needed for back pain -Consider adding low dose of Lyrica tomorrow per Dr. Sabra Heck if patient continues to complain of pain and has anxiety around her somatic complaints.   Elmarie Shiley, NP 04/26/2015, 4:26 PM I personally assessed the patient and formulated the plan Geralyn Flash A. Sabra Heck, M.D.

## 2015-04-26 NOTE — BHH Group Notes (Signed)
[facility] LCSW Aftercare Discharge Planning Group Note  04/26/2015 8:45 AM  Pt did not attend, declined invitation.   Peri Maris, [place] 04/26/2015 10:21 AM

## 2015-04-26 NOTE — Progress Notes (Signed)
"  I'm not doing too well. My back pain is causing me all kinds of problems." Her conversation tonight was strictly directed towards her back pain and how no one has been able to diagnose why she is having so much back pain. She rates her pain 4/10 Anxiety 4/10 and Depression 0/10.  Her goal is to "figure out why my back pain won't go away".  Denies SI/HI/AVH. Encouragement and support given. Medications administered as prescribed. Continue Q 15 minute checks for patient safety and medication effectiveness.

## 2015-04-27 ENCOUNTER — Encounter (HOSPITAL_COMMUNITY): Payer: Self-pay | Admitting: Registered Nurse

## 2015-04-27 DIAGNOSIS — F411 Generalized anxiety disorder: Secondary | ICD-10-CM

## 2015-04-27 DIAGNOSIS — F322 Major depressive disorder, single episode, severe without psychotic features: Principal | ICD-10-CM | POA: Insufficient documentation

## 2015-04-27 DIAGNOSIS — G4700 Insomnia, unspecified: Secondary | ICD-10-CM

## 2015-04-27 DIAGNOSIS — M25552 Pain in left hip: Secondary | ICD-10-CM | POA: Insufficient documentation

## 2015-04-27 LAB — VITAMIN D 25 HYDROXY (VIT D DEFICIENCY, FRACTURES): VIT D 25 HYDROXY: 36.3 ng/mL (ref 30.0–100.0)

## 2015-04-27 LAB — HEMOGLOBIN A1C
HEMOGLOBIN A1C: 5.9 % — AB (ref 4.8–5.6)
Mean Plasma Glucose: 123 mg/dL

## 2015-04-27 LAB — RHEUMATOID FACTOR: Rhuematoid fact SerPl-aCnc: <10 IU/mL (ref 0.0–13.9)

## 2015-04-27 MED ORDER — MIRTAZAPINE 7.5 MG PO TABS
7.5000 mg | ORAL_TABLET | Freq: Every day | ORAL | Status: DC
  Administered 2015-04-27 – 2015-05-02 (×6): 7.5 mg via ORAL
  Filled 2015-04-27 (×8): qty 1

## 2015-04-27 MED ORDER — DICLOFENAC SODIUM 1 % TD GEL
2.0000 g | Freq: Four times a day (QID) | TRANSDERMAL | Status: DC
  Administered 2015-04-27 – 2015-05-02 (×20): 2 g via TOPICAL
  Filled 2015-04-27: qty 100

## 2015-04-27 MED ORDER — DULOXETINE HCL 30 MG PO CPEP
30.0000 mg | ORAL_CAPSULE | Freq: Every day | ORAL | Status: DC
  Administered 2015-04-28 – 2015-04-30 (×3): 30 mg via ORAL
  Filled 2015-04-27 (×4): qty 1

## 2015-04-27 NOTE — Progress Notes (Signed)
DAR NOTE: Pt present with flat affect and depressed mood in the unit. Pt has been isolating herself most of the time. Pt complains of right hip pain, took all her meds as scheduled. As per self inventory, pt had a fair night sleep, fair appetite, low energy, and poor concentration. Pt rate depression at 8, hopeless ness at 8, and anxiety at a 8. Pt's goal for to today is "to go home pain free." Pt's safety ensured with 15 minute and environmental checks. Pt currently denies SI/HI and A/V hallucinations. Pt verbally agrees to seek staff if SI/HI or A/VH occurs and to consult with staff before acting on these thoughts. Will continue POC.

## 2015-04-27 NOTE — Progress Notes (Signed)
[facility] MD Progress Note  04/27/2015 10:19 AM Katherine Frye  MRN:  [id number] Subjective:  "I feel tired until about 9 or 10 AM everyday and after that I'm fine" Patient seen by this provider, case reviewed with nursing.  On evaluation:  Katherine Frye is in bed stating that she feels tired; but after 9 or 10 in the morning when she gets up she then feels fine.  Reports that she is tolerating her medications; and attending her groups.  States that she was having trouble sleeping until she was started on medications which helps her sleep better.  At this time she rates her Depression 5/10 and Anxiety 5/10 (0/none and 10 worse).  Denies suicidal/homicidal ideation, psychosis, and paranoia.  She does have complaints of left hip pain and states that she uses a cream at home that helps with the pain.     Principal Problem: Major depression, single episode Diagnosis:   Patient Active Problem List   Diagnosis Date Noted  . MDD (major depressive disorder), single episode, severe , no psychosis ([place]) [F32.2]   . Left hip pain [M25.552]   . Generalized anxiety disorder [F41.1] 04/24/2015  . Major depression, single episode [F32.9] 04/24/2015  . Anxiety [F41.9]   . Depression [F32.9] 03/19/2015  . Anxiety state [F41.1] 03/19/2015  . Insomnia [G47.00] 03/19/2015  . Dizziness [R42] 03/19/2015  . Transient alteration of awareness [R40.4] 03/19/2015  . Tremor [R25.1] 06/05/2014  . Memory loss [R41.3] 06/05/2014  . Cancer ([place]) [C80.1]   . Malignant neoplasm of upper-inner quadrant of female breast ([place]) [C50.219]    Total Time spent with patient: 15 minutes  Past Psychiatric History: See H & P  Past Medical History:  Past Medical History  Diagnosis Date  . Allergy 2010    medication and seasonal  . Special screening for malignant neoplasms, colon 2013  . Obesity, unspecified 2013  . Cancer [facility]) 2010    right breast  . Malignant neoplasm of upper-inner quadrant of female breast ([place]) 2010  .  Solitary cyst of breast 2012    left    Past Surgical History  Procedure Laterality Date  . Colonoscopy  2010    Dr. Lyndel Safe, [place]  . Upper gi endoscopy  2010  . Foot surgery  1996  . Tonsillectomy    . Appendectomy    . Nasal sinus surgery  1991  . Skin cancer excision  2013    basel cell on forehead  . Knee surgery Right 2013    replacement  . Knee arthroscopy Right 2012    May and October  . Tubal ligation    . Breast surgery Right 2010    T2, N0, ER/PR positive, HER-2/neu negative, Oncotype recurrence score  of 10 ( 7% risk of recurrence)   . Breast cyst excision Right 2006    fibroid cyst  . Breast biopsy Right 2010   Family History:  Family History  Problem Relation Age of Onset  . Lymphoma Sister     non-hodgkin  . Cancer Sister     cancer of thyroid  . Cancer Other     family history of colon breast and ovarian cancer, no relationships listed   Family Psychiatric  History: See H & P Social History:  History  Alcohol Use No     History  Drug Use No    Social History   Social History  . Marital Status: Married    Spouse Name: Eugene Zeiders "Vic"  . Number  of Children: 2  . Years of Education: 12   Social History Main Topics  . Smoking status: Never Smoker   . Smokeless tobacco: Never Used  . Alcohol Use: No  . Drug Use: No  . Sexual Activity: Not Asked   Other Topics Concern  . None   Social History Narrative   Lives at home with husband.   Caffeine use: Drinks 1 cup coffee per day (sometimes 2 cups per day)      Additional Social History:   Sleep: Fair, Improving with medication  Appetite:  Fair, Improving  Current Medications: Current Facility-Administered Medications  Medication Dose Route Frequency Provider Last Rate Last Dose  . calcium-vitamin D (OSCAL WITH D) 500-200 MG-UNIT per tablet 2 tablet  2 tablet Oral Daily Delfin Gant, NP   2 tablet at 04/27/15 0847  . cholecalciferol (VITAMIN D) tablet 7,000 Units  7,000  Units Oral Daily Delfin Gant, NP   7,000 Units at 04/27/15 0847  . diclofenac sodium (VOLTAREN) 1 % transdermal gel 2 g  2 g Topical QID Shuvon B Rankin, NP      . [START ON 04/28/2015] DULoxetine (CYMBALTA) DR capsule 30 mg  30 mg Oral Daily Shuvon B Rankin, NP      . hydrOXYzine (ATARAX/VISTARIL) tablet 25 mg  25 mg Oral BID PC Delfin Gant, NP   25 mg at 04/27/15 0849  . ibuprofen (ADVIL,MOTRIN) tablet 400 mg  400 mg Oral Q6H PRN Delfin Gant, NP   400 mg at 04/26/15 0418  . loperamide (IMODIUM) capsule 2 mg  2 mg Oral Q6H PRN Encarnacion Slates, NP   2 mg at 04/25/15 1321  . LORazepam (ATIVAN) tablet 1 mg  1 mg Oral Q6H PRN Delfin Gant, NP      . mirtazapine (REMERON) tablet 7.5 mg  7.5 mg Oral QHS Shuvon B Rankin, NP      . multivitamin with minerals tablet 1 tablet  1 tablet Oral Daily Delfin Gant, NP   1 tablet at 04/27/15 0847  . vitamin B-12 (CYANOCOBALAMIN) tablet 1,000 mcg  1,000 mcg Oral Daily Delfin Gant, NP   1,000 mcg at 04/27/15 0600    Lab Results:  Results for orders placed or performed during the hospital encounter of 04/24/15 (from the past 48 hour(s))  Sjogrens syndrome-B extractable nuclear antibody     Status: None   Collection Time: 04/25/15  6:30 PM  Result Value Ref Range   SSB ([place]) (ENA) Antibody, IgG <0.2 0.0 - 0.9 AI    Comment: (NOTE) Performed At: [facility] [place], [place] [id number] Lindon Romp MD [id number] Performed at [facility]   Sedimentation rate     Status: None   Collection Time: 04/26/15  6:18 AM  Result Value Ref Range   Sed Rate 3 0 - 22 mm/hr    Comment: Performed at [facility]  TSH     Status: None   Collection Time: 04/26/15  6:18 AM  Result Value Ref Range   TSH 1.706 0.350 - 4.500 uIU/mL    Comment: Performed at [facility]  Hemoglobin A1c     Status: Abnormal   Collection Time: 04/26/15  6:18 AM  Result  Value Ref Range   Hgb A1c MFr Bld 5.9 (H) 4.8 - 5.6 %    Comment: (NOTE)         Pre-diabetes: 5.7 - 6.4  Diabetes: >6.4         Glycemic control for adults with diabetes: <7.0    Mean Plasma Glucose 123 mg/dL    Comment: (NOTE) Performed At: [facility] [place], [place] [id number] Lindon Romp MD [id number] Performed at [facility]   Rheumatoid factor     Status: None   Collection Time: 04/26/15  6:18 AM  Result Value Ref Range   Rhuematoid fact SerPl-aCnc <10.0 0.0 - 13.9 IU/mL    Comment: (NOTE) Performed At: [facility] [address] [place], [place] [id number] Lindon Romp MD [id number] Performed at [facility]   VITAMIN D 25 Hydroxy (Vit-D Deficiency, Fractures)     Status: None   Collection Time: 04/26/15  6:18 AM  Result Value Ref Range   Vit D, 25-Hydroxy 36.3 30.0 - 100.0 ng/mL    Comment: (NOTE) Vitamin D deficiency has been defined by the Institute of Medicine and an Endocrine Society practice guideline as a level of serum 25-OH vitamin D less than 20 ng/mL (1,2). The Endocrine Society went on to further define vitamin D insufficiency as a level between 21 and 29 ng/mL (2). 1. IOM (Institute of Medicine). 2010. Dietary reference   intakes for calcium and D. Harris: The   [employer]. 2. Holick MF, Binkley Mahomet, Bischoff-Ferrari HA, et al.   Evaluation, treatment, and prevention of vitamin D   deficiency: an Endocrine Society clinical practice   guideline. JCEM. 2011 Jul; 96(7):1911-30. Performed At: [facility] [place], [place] [id number] Lindon Romp MD [id number] Performed at [facility]     Blood Alcohol level:  Lab Results  Component Value Date   [facility] <5 04/24/2015    Physical Findings: AIMS: Facial and Oral Movements Muscles of Facial Expression: None, normal Lips and Perioral Area:  None, normal Jaw: None, normal Tongue: None, normal,Extremity Movements Upper (arms, wrists, hands, fingers): None, normal Lower (legs, knees, ankles, toes): None, normal, Trunk Movements Neck, shoulders, hips: None, normal, Overall Severity Severity of abnormal movements (highest score from questions above): None, normal Incapacitation due to abnormal movements: None, normal Patient's awareness of abnormal movements (rate only patient's report): No Awareness, Dental Status Current problems with teeth and/or dentures?: No Does patient usually wear dentures?: No  CIWA:    COWS:     Musculoskeletal: Strength & Muscle Tone: within normal limits Gait & Station: normal Patient leans: N/A  Psychiatric Specialty Exam: Review of Systems  Musculoskeletal: Positive for myalgias and back pain.  Psychiatric/Behavioral: Positive for depression. Negative for suicidal ideas, hallucinations, memory loss and substance abuse. The patient is nervous/anxious and has insomnia.     Blood pressure 99/68, pulse 90, temperature 97.4 F (36.3 C), temperature source Oral, resp. rate 16, height 5' 10"  (1.778 m), weight 87.544 kg (193 lb), SpO2 100 %.Body mass index is 27.69 kg/(m^2).  General Appearance: Casual and Disheveled  Eye Contact::  Good  Speech:  Clear and Coherent  Volume:  Normal  Mood:  Anxious and Depressed  Affect:  Congruent  Thought Process:  Coherent, Goal Directed and Intact  Orientation:  Full (Time, Place, and Person)  Thought Content:  Rumination  Suicidal Thoughts:  No  Homicidal Thoughts:  No  Memory:  Immediate;   Good Recent;   Good Remote;   Good  Judgement:  Fair  Insight:  Fair  Psychomotor Activity:  Normal  Concentration:  Good  Recall:  [place]  of Knowledge:Fair  Language: Good  Akathisia:  Negative  Handed:  Right  AIMS (if indicated):     Assets:  Communication Skills Desire for Improvement Financial Resources/Insurance Leisure Time Physical  Health Resilience Social Support Vocational/Educational  ADL's:  Intact  Cognition: WNL  Sleep:  Number of Hours: 6.75   Treatment Plan Summary: Daily contact with patient to assess and evaluate symptoms and progress in treatment and Medication management  -Decrease Cymbalta to 30 mg daily.  Continue slowly wean patient off  -Start Remeron 7.5 mg hs for insomnia and depressive symptoms - Continue Vistaril 25 mg Q 6 hr prn  Anxiety and Ativan 1 mg Q 6 hr Agitation -Continue motrin 400 mg every six hours as needed for back pain - Started Voltaren 1% transdermal gel 2 gm to be applied to left hip Qid for Pain.    Continue to consider adding low dose of Lyrica  per Dr. Sabra Heck if patient continues to complain of pain and has anxiety around her somatic complaints.    Rankin, Shuvon, NP 04/27/2015, 10:19 AM  I reviewed chart and agreed with the findings and treatment Plan.  Berniece Andreas, MD

## 2015-04-27 NOTE — BHH Group Notes (Signed)
[facility] Group Notes:  (Nursing/MHT/Case Management/Adjunct)  Date:  04/27/2015  Time:  3:44 PM   Type of Therapy:  Psychoeducational Skills  Participation Level:  Did Not Attend  Participation Quality:  DID NOT ATTEND  Affect:  DID NOT ATTEND  Cognitive:  DID NOT ATTEND  Insight:  None  Engagement in Group:  DID NOT ATTEND  Modes of Intervention:  DID NOT ATTEND  Summary of Progress/Problems: Pt did not attend patient self inventory group.  Katherine Frye 04/27/2015, 3:44 PM

## 2015-04-27 NOTE — [facility] Group Notes (Signed)
[facility] LCSW Group Therapy  04/27/2015 10:00 AM   Type of Therapy:  Group Therapy  Participation Level:  Did Not Attend  Theressa Millard, LCSW 04/27/2015 11:54 AM

## 2015-04-27 NOTE — Progress Notes (Signed)
D.  Pt pleasant on approach, complaint of anxiety but no pain at the moment.  Pt states her pain is always in the morning and requests to receive her Voltaren cream and Ibuprofen at that time.  Pt has been mostly in her room but has interacting appropriately with her new roommate.  Pt's daughter, Dortha Kern, would like to be called about coming in to speak to the psychiatrist or receive a call from the psychiatrist so she can discuss her mother's care.  Pt denies SI/HI/hallucinations at this time.  A.  Support and encouragement offered, medication given as ordered  R.  Pt remains safe on unit, will continue to monitor.

## 2015-04-27 NOTE — Progress Notes (Signed)
  D: Pt complained of her back "burning when she wakes in the mornings". Stated that she's informed the nurse and doctors. Stated, "I feel bad when I get up, but it's better by 1200". Pt laughed stating that she doesn't want to go to bed, because she doesn't want to feel bad upon rising. Pt has no questions or concerns.    A:  Support and encouragement was offered. 15 min checks continued for safety.  R: Pt remains safe.

## 2015-04-28 DIAGNOSIS — F322 Major depressive disorder, single episode, severe without psychotic features: Secondary | ICD-10-CM | POA: Insufficient documentation

## 2015-04-28 LAB — SJOGRENS SYNDROME-A EXTRACTABLE NUCLEAR ANTIBODY: SSA (Ro) (ENA) Antibody, IgG: <0.2 AI (ref 0.0–0.9)

## 2015-04-28 NOTE — Progress Notes (Signed)
Writer spoke with patient and she reports that her day started off rough d/t her pain but got better as the morning went on. She reports that the voltaren cream seems to help quite a bit with her pain. She has been observed up in the dayroom, attended group and requested her medications after her shower. She denies si/hi/a/v hallucinations. Support given and safety maintained on unit with 15 checks.

## 2015-04-28 NOTE — Progress Notes (Signed)
[facility] MD Progress Note  04/28/2015 12:38 PM Katherine Frye  MRN:  [id number] Subjective:  "I feel much better today; and my leg is not hurting; I guess cause I slept good last night." Patient seen by this provider, case reviewed with nursing.  On evaluation:  Katherine Frye Continues to rate her Depression 5/10 and Anxiety 5/10 (0/none and 10 worse).  Denies suicidal/homicidal ideation, psychosis, and paranoia. Reports that she is tolerating her medications; and attending/participating in group session; eating without difficulty; and sleeping better.  States that the cream (Voltaren gel) help with her hip pain.       Principal Problem: Major depression, single episode Diagnosis:   Patient Active Problem List   Diagnosis Date Noted  . MDD (major depressive disorder), single episode, severe , no psychosis ([place]) [F32.2]   . Left hip pain [M25.552]   . Generalized anxiety disorder [F41.1] 04/24/2015  . Major depression, single episode [F32.9] 04/24/2015  . Anxiety [F41.9]   . Depression [F32.9] 03/19/2015  . Anxiety state [F41.1] 03/19/2015  . Insomnia [G47.00] 03/19/2015  . Dizziness [R42] 03/19/2015  . Transient alteration of awareness [R40.4] 03/19/2015  . Tremor [R25.1] 06/05/2014  . Memory loss [R41.3] 06/05/2014  . Cancer ([place]) [C80.1]   . Malignant neoplasm of upper-inner quadrant of female breast ([place]) [C50.219]    Total Time spent with patient: 15 minutes  Past Psychiatric History: See H & P  Past Medical History:  Past Medical History  Diagnosis Date  . Allergy 2010    medication and seasonal  . Special screening for malignant neoplasms, colon 2013  . Obesity, unspecified 2013  . Cancer [facility] [place]) 2010    right breast  . Malignant neoplasm of upper-inner quadrant of female breast ([place]) 2010  . Solitary cyst of breast 2012    left    Past Surgical History  Procedure Laterality Date  . Colonoscopy  2010    Dr. Lyndel Safe, [place]  . Upper gi endoscopy  2010  . Foot surgery   1996  . Tonsillectomy    . Appendectomy    . Nasal sinus surgery  1991  . Skin cancer excision  2013    basel cell on forehead  . Knee surgery Right 2013    replacement  . Knee arthroscopy Right 2012    May and October  . Tubal ligation    . Breast surgery Right 2010    T2, N0, ER/PR positive, HER-2/neu negative, Oncotype recurrence score  of 10 ( 7% risk of recurrence)   . Breast cyst excision Right 2006    fibroid cyst  . Breast biopsy Right 2010   Family History:  Family History  Problem Relation Age of Onset  . Lymphoma Sister     non-hodgkin  . Cancer Sister     cancer of thyroid  . Cancer Other     family history of colon breast and ovarian cancer, no relationships listed   Family Psychiatric  History: See H & P Social History:  History  Alcohol Use No     History  Drug Use No    Social History   Social History  . Marital Status: Married    Spouse Name: Anais Denslow "Vic"  . Number of Children: 2  . Years of Education: 12   Social History Main Topics  . Smoking status: Never Smoker   . Smokeless tobacco: Never Used  . Alcohol Use: No  . Drug Use: No  . Sexual Activity: Not Asked  Other Topics Concern  . None   Social History Narrative   Lives at home with husband.   Caffeine use: Drinks 1 cup coffee per day (sometimes 2 cups per day)      Additional Social History:   Sleep: Fair, Improving with medication  Appetite:  Good  Current Medications: Current Facility-Administered Medications  Medication Dose Route Frequency Provider Last Rate Last Dose  . calcium-vitamin D (OSCAL WITH D) 500-200 MG-UNIT per tablet 2 tablet  2 tablet Oral Daily Delfin Gant, NP   2 tablet at 04/28/15 0752  . cholecalciferol (VITAMIN D) tablet 7,000 Units  7,000 Units Oral Daily Delfin Gant, NP   7,000 Units at 04/28/15 0752  . diclofenac sodium (VOLTAREN) 1 % transdermal gel 2 g  2 g Topical QID Shuvon B Rankin, NP   2 g at 04/28/15 1148  . DULoxetine  (CYMBALTA) DR capsule 30 mg  30 mg Oral Daily Shuvon B Rankin, NP   30 mg at 04/28/15 0752  . hydrOXYzine (ATARAX/VISTARIL) tablet 25 mg  25 mg Oral BID PC Delfin Gant, NP   25 mg at 04/28/15 0752  . ibuprofen (ADVIL,MOTRIN) tablet 400 mg  400 mg Oral Q6H PRN Delfin Gant, NP   400 mg at 04/28/15 0605  . loperamide (IMODIUM) capsule 2 mg  2 mg Oral Q6H PRN Encarnacion Slates, NP   2 mg at 04/25/15 1321  . LORazepam (ATIVAN) tablet 1 mg  1 mg Oral Q6H PRN Delfin Gant, NP   1 mg at 04/28/15 0001  . mirtazapine (REMERON) tablet 7.5 mg  7.5 mg Oral QHS Shuvon B Rankin, NP   7.5 mg at 04/27/15 2156  . multivitamin with minerals tablet 1 tablet  1 tablet Oral Daily Delfin Gant, NP   1 tablet at 04/28/15 0752  . vitamin B-12 (CYANOCOBALAMIN) tablet 1,000 mcg  1,000 mcg Oral Daily Delfin Gant, NP   1,000 mcg at 04/28/15 2800    Lab Results:  No results found for this or any previous visit (from the past 48 hour(s)).  Blood Alcohol level:  Lab Results  Component Value Date   ETH <5 04/24/2015    Physical Findings: AIMS: Facial and Oral Movements Muscles of Facial Expression: None, normal Lips and Perioral Area: None, normal Jaw: None, normal Tongue: None, normal,Extremity Movements Upper (arms, wrists, hands, fingers): None, normal Lower (legs, knees, ankles, toes): None, normal, Trunk Movements Neck, shoulders, hips: None, normal, Overall Severity Severity of abnormal movements (highest score from questions above): None, normal Incapacitation due to abnormal movements: None, normal Patient's awareness of abnormal movements (rate only patient's report): No Awareness, Dental Status Current problems with teeth and/or dentures?: No Does patient usually wear dentures?: No  CIWA:    COWS:     Musculoskeletal: Strength & Muscle Tone: within normal limits Gait & Station: normal Patient leans: N/A  Psychiatric Specialty Exam: Review of Systems  Musculoskeletal:  Positive for myalgias and back pain.  Psychiatric/Behavioral: Positive for depression (Improving). Negative for suicidal ideas, hallucinations, memory loss and substance abuse. The patient is nervous/anxious (Improving) and has insomnia (Improving).     Blood pressure 99/68, pulse 90, temperature 97.4 F (36.3 C), temperature source Oral, resp. rate 16, height 5' 10"  (1.778 m), weight 87.544 kg (193 lb), SpO2 100 %.Body mass index is 27.69 kg/(m^2).  General Appearance: Casual and Disheveled  Eye Contact::  Good  Speech:  Clear and Coherent  Volume:  Normal  Mood:  Depressed  Affect:  Congruent  Thought Process:  Coherent, Goal Directed and Intact  Orientation:  Full (Time, Place, and Person)  Thought Content:  Denies hallucinations, delusions, and paranoia  Suicidal Thoughts:  No  Homicidal Thoughts:  No  Memory:  Immediate;   Good Recent;   Good Remote;   Good  Judgement:  Fair  Insight:  Fair  Psychomotor Activity:  Normal  Concentration:  Good  Recall:  Good  Fund of Knowledge:Good  Language: Good  Akathisia:  No  Handed:  Right  AIMS (if indicated):     Assets:  Communication Skills Desire for Improvement Financial Resources/Insurance Leisure Time Physical Health Resilience Social Support Vocational/Educational  ADL's:  Intact  Cognition: WNL  Sleep:  Number of Hours: 6.25   Treatment Plan Summary: Daily contact with patient to assess and evaluate symptoms and progress in treatment and Medication management  -Decrease Cymbalta to 30 mg daily.  Continue slowly wean patient off  -Start Remeron 7.5 mg hs for insomnia and depressive symptoms - Continue Vistaril 25 mg Q 6 hr prn  Anxiety and Ativan 1 mg Q 6 hr Agitation -Continue motrin 400 mg every six hours as needed for back pain - Continue Voltaren 1% transdermal gel 2 gm to be applied to left hip Qid for Pain.    Continue to consider adding low dose of Lyrica  per Dr. Sabra Heck if patient continues to complain of pain  and has anxiety around her somatic complaints.    Continue with current medications; no changes at this time.  Lyrica not started related to improvement of pain with Voltaren gel.  Rankin, Shuvon, NP 04/28/2015, 12:38 PM

## 2015-04-28 NOTE — Progress Notes (Signed)
DAR NOTE: Pt present with flat affect and depressed mood in the unit. Pt has been isolating herself and has been bed most of the time. Pt complain of right hip pain, took all her meds as scheduled. As per self inventory, pt had a good night sleep, fair appetite, low energy, and poor concentration. Pt rate depression at 8, hopeless ness at 8, and anxiety at 7. Pt's safety ensured with 15 minute and environmental checks. Pt currently denies SI/HI and A/V hallucinations. Pt verbally agrees to seek staff if SI/HI or A/VH occurs and to consult with staff before acting on these thoughts. Will continue POC.

## 2015-04-28 NOTE — Progress Notes (Signed)
Adult Psychoeducational Group Note  Date:  04/28/2015 Time:  9:16 PM  Group Topic/Focus:  Wrap-Up Group:   The focus of this group is to help patients review their daily goal of treatment and discuss progress on daily workbooks.  Participation Level:  Active  Participation Quality:  Appropriate and Attentive  Affect:  Appropriate  Cognitive:  Appropriate  Insight: Appropriate and Good  Engagement in Group:  Engaged  Modes of Intervention:  Discussion  Additional Comments:  Pt mentioned she had a bad morning but now her day is an 7 out of 10. Pt goal for tomorrow is to talk with doctors about medication.   Jerline Pain 04/28/2015, 9:16 PM

## 2015-04-28 NOTE — [facility] Group Notes (Signed)
[facility] LCSW Group Therapy Note  04/28/2015 /10 AM  Type of Therapy and Topic:  Group Therapy: Feelings about Discharge and Establishing a Support System  Participation Level:  Did not attend; although patient was seen in her room and encouraged to attend.   Sheilah Pigeon, LCSW

## 2015-04-28 NOTE — [facility] Group Notes (Signed)
[facility] Group Notes:  (Nursing/MHT/Case Management/Adjunct)  Date:  04/28/2015  Time:  1:10 PM  Type of Therapy:  Psychoeducational Skills  Participation Level:  Did Not Attend  Participation Quality:  DID NOT ATTEND  Affect:  DID NOT ATTEND  Cognitive:  DID NOT ATTEND  Insight:  None  Engagement in Group:  DID NOT ATTEND  Modes of Intervention:  DID NOT ATTEND  Summary of Progress/Problems: Pt did not attend patient self inventory group.   Wolfgang Phoenix 04/28/2015, 1:10 PM

## 2015-04-29 LAB — FANA STAINING PATTERNS

## 2015-04-29 LAB — ANTINUCLEAR ANTIBODIES, IFA: ANA Ab, IFA: POSITIVE — AB

## 2015-04-29 NOTE — Plan of Care (Signed)
Problem: Ineffective individual coping Goal: LTG: Patient will report a decrease in negative feelings Outcome: Progressing Patient currently denies suicidal ideations and reports that she feels better.

## 2015-04-29 NOTE — Progress Notes (Signed)
Adult Psychoeducational Group Note  Date:  04/29/2015 Time:  9:27 PM  Group Topic/Focus:  Wrap-Up Group:   The focus of this group is to help patients review their daily goal of treatment and discuss progress on daily workbooks.  Participation Level:  Active  Participation Quality:  Appropriate and Attentive  Affect:  Appropriate  Cognitive:  Appropriate  Insight: Appropriate and Good  Engagement in Group:  Engaged  Modes of Intervention:  Discussion  Additional Comments:  Pt goal is to check to see if one of her medication is causing her to have an allergic reaction.   Katherine Frye 04/29/2015, 9:27 PM

## 2015-04-29 NOTE — Progress Notes (Signed)
Patient ID: Katherine Frye, female   DOB: 1945/02/05, 70 y.o.   MRN: [id number]  Pt currently presents with a flat affect and anxious behavior. Per self inventory, pt rates depression at a 7, hopelessness 5 and anxiety 5. Pt's daily goal is to "to get back to myself" and they intend to do so by "don't know." Pt reports fair sleep, a good appetite, low energy and poor concentration. Pt writes, "I feel like my pain is too high."   Pt provided with medications per providers orders. Pt's labs and vitals were monitored throughout the day. Pt supported emotionally and encouraged to express concerns and questions. Pt educated on medications, side effects and depression/anxiety diagnosis.   Pt's safety ensured with 15 minute and environmental checks. Pt currently denies SI/HI and A/V hallucinations. Pt verbally agrees to seek staff if SI/HI or A/VH occurs and to consult with staff before acting on these thoughts. Pt reports concerns that she "cannot be helped." Reports that she does not like how Cymbalta makes her "feel" and that she is lethargic in the morning but has "a lot of energy" at night. Md notified. Will continue POC.

## 2015-04-29 NOTE — [facility] Group Notes (Addendum)
[facility] LCSW Group Therapy  04/29/2015 4:49 PM  Type of Therapy:  Group Therapy  Participation Level:  Active  Participation Quality:  Limited  Affect:  Anxious  Cognitive:  Limited  Insight:  Developing/Improving  Engagement in Therapy:  Developing/Improving  Modes of Intervention:  Discussion, Exploration, Problem-solving and Support  Summary of Progress/Problems:  Today's Topic: Overcoming Obstacles. Patients identified one short term goal and potential obstacles in reaching this goal. Patients processed barriers involved in overcoming these obstacles. Patients identified steps necessary for overcoming these obstacles and explored motivation (internal and external) for facing these difficulties head on.  Patient expressed significant distress over continued issues w pain, feels her concerns have not been addressed adequately.  Wants to find "the courage to go home despite being in pain", states she will "ask God to help me to do this hard thing."  Asked CSW to discuss her concerns re pain and current medications w medical staff, feels her concerns have not bee addressed adequately.   Beverely Pace 04/29/2015, 4:49 PM

## 2015-04-29 NOTE — Progress Notes (Signed)
D:Patient in the hallway on approach.  Patient states she had a bad day.  Patient preoccupied about medications and states she feels Cymbalta is contributed to her problems.  Patient denies SI/HI and denies AVH. A: Staff to monitor Q 15 mins for safety.  Encouragement and support offered.  Scheduled medications administered per orders.   R: Patient remains safe on the unit.  Patient attended group tonight.  Patient visible on the unit and interacting with peers.  Patient taking administered medications.

## 2015-04-29 NOTE — Progress Notes (Signed)
Patient ID: Katherine Frye, female   DOB: 1945-09-18, 70 y.o.   MRN: [id number] [facility] MD Progress Note  04/29/2015 2:34 PM Katherine Frye  MRN:  [id number] Subjective:  Patient reports she has chronic lower back pain, which she describes a neuropathic type pain ( tingling, subjective sense of burning). States pain does not refer , and is localized to her back.  Expresses frustration that prior work up for this condition has been negative and states " it is not all in  My head". Describes anxiety, depression, which are at least partially related to chronic pain issue. Reports partial improvement since admission but continues to feel anxious, depressed. Denies suicidal ideations . Reports some dizziness as side effect. States that Neurontin trial was not well tolerated due to dizziness . Objective : Patient case reviewed with staff and patient seen. As above, reports ongoing anxiety, sense of depression. Denies suicidal ideations, denies psychotic symptoms. Denies suicidal ideations, but reports sense of frustration related to chronic pain . Currently on Remeron and Cymbalta.  Voltaren topical cream has helped alleviate pain. Group participation has been limited, but today more visible on unit .   Principal Problem: Major depression, single episode Diagnosis:   Patient Active Problem List   Diagnosis Date Noted  . Major depressive disorder, single episode, severe without psychotic features ([place]) [F32.2]   . MDD (major depressive disorder), single episode, severe , no psychosis ([place]) [F32.2]   . Left hip pain [M25.552]   . Generalized anxiety disorder [F41.1] 04/24/2015  . Major depression, single episode [F32.9] 04/24/2015  . Anxiety [F41.9]   . Depression [F32.9] 03/19/2015  . Anxiety state [F41.1] 03/19/2015  . Insomnia [G47.00] 03/19/2015  . Dizziness [R42] 03/19/2015  . Transient alteration of awareness [R40.4] 03/19/2015  . Tremor [R25.1] 06/05/2014  . Memory loss [R41.3] 06/05/2014  .  Cancer ([place]) [C80.1]   . Malignant neoplasm of upper-inner quadrant of female breast ([place]) [C50.219]    Total Time spent with patient: 25 minutes   Past Psychiatric History: See H & P  Past Medical History:  Past Medical History  Diagnosis Date  . Allergy 2010    medication and seasonal  . Special screening for malignant neoplasms, colon 2013  . Obesity, unspecified 2013  . Cancer [facility]) 2010    right breast  . Malignant neoplasm of upper-inner quadrant of female breast ([place]) 2010  . Solitary cyst of breast 2012    left    Past Surgical History  Procedure Laterality Date  . Colonoscopy  2010    Dr. Lyndel Safe, [place]  . Upper gi endoscopy  2010  . Foot surgery  1996  . Tonsillectomy    . Appendectomy    . Nasal sinus surgery  1991  . Skin cancer excision  2013    basel cell on forehead  . Knee surgery Right 2013    replacement  . Knee arthroscopy Right 2012    May and October  . Tubal ligation    . Breast surgery Right 2010    T2, N0, ER/PR positive, HER-2/neu negative, Oncotype recurrence score  of 10 ( 7% risk of recurrence)   . Breast cyst excision Right 2006    fibroid cyst  . Breast biopsy Right 2010   Family History:  Family History  Problem Relation Age of Onset  . Lymphoma Sister     non-hodgkin  . Cancer Sister     cancer of thyroid  . Cancer Other     family  history of colon breast and ovarian cancer, no relationships listed   Family Psychiatric  History: See H & P Social History:  History  Alcohol Use No     History  Drug Use No    Social History   Social History  . Marital Status: Married    Spouse Name: Sheetal Lyall "Vic"  . Number of Children: 2  . Years of Education: 12   Social History Main Topics  . Smoking status: Never Smoker   . Smokeless tobacco: Never Used  . Alcohol Use: No  . Drug Use: No  . Sexual Activity: Not Asked   Other Topics Concern  . None   Social History Narrative   Lives at home with husband.   Caffeine  use: Drinks 1 cup coffee per day (sometimes 2 cups per day)      Additional Social History:   Sleep: improving   Appetite:  Fair  Current Medications: Current Facility-Administered Medications  Medication Dose Route Frequency Provider Last Rate Last Dose  . calcium-vitamin D (OSCAL WITH D) 500-200 MG-UNIT per tablet 2 tablet  2 tablet Oral Daily Delfin Gant, NP   1 tablet at 04/29/15 0846  . cholecalciferol (VITAMIN D) tablet 7,000 Units  7,000 Units Oral Daily Delfin Gant, NP   7,000 Units at 04/29/15 0800  . diclofenac sodium (VOLTAREN) 1 % transdermal gel 2 g  2 g Topical QID Shuvon B Rankin, NP   2 g at 04/29/15 0857  . DULoxetine (CYMBALTA) DR capsule 30 mg  30 mg Oral Daily Shuvon B Rankin, NP   30 mg at 04/29/15 0848  . hydrOXYzine (ATARAX/VISTARIL) tablet 25 mg  25 mg Oral BID PC Delfin Gant, NP   25 mg at 04/29/15 0848  . ibuprofen (ADVIL,MOTRIN) tablet 400 mg  400 mg Oral Q6H PRN Delfin Gant, NP   400 mg at 04/28/15 0605  . loperamide (IMODIUM) capsule 2 mg  2 mg Oral Q6H PRN Encarnacion Slates, NP   2 mg at 04/28/15 1333  . LORazepam (ATIVAN) tablet 1 mg  1 mg Oral Q6H PRN Delfin Gant, NP   1 mg at 04/28/15 0001  . mirtazapine (REMERON) tablet 7.5 mg  7.5 mg Oral QHS Shuvon B Rankin, NP   7.5 mg at 04/28/15 2134  . multivitamin with minerals tablet 1 tablet  1 tablet Oral Daily Delfin Gant, NP   1 tablet at 04/29/15 0848  . vitamin B-12 (CYANOCOBALAMIN) tablet 1,000 mcg  1,000 mcg Oral Daily Delfin Gant, NP   1,000 mcg at 04/29/15 1287    Lab Results:  No results found for this or any previous visit (from the past 64 hour(s)).  Blood Alcohol level:  Lab Results  Component Value Date   ETH <5 04/24/2015    Physical Findings: AIMS: Facial and Oral Movements Muscles of Facial Expression: None, normal Lips and Perioral Area: None, normal Jaw: None, normal Tongue: None, normal,Extremity Movements Upper (arms, wrists, hands,  fingers): None, normal Lower (legs, knees, ankles, toes): None, normal, Trunk Movements Neck, shoulders, hips: None, normal, Overall Severity Severity of abnormal movements (highest score from questions above): None, normal Incapacitation due to abnormal movements: None, normal Patient's awareness of abnormal movements (rate only patient's report): No Awareness, Dental Status Current problems with teeth and/or dentures?: No Does patient usually wear dentures?: No  CIWA:    COWS:     Musculoskeletal: Strength & Muscle Tone: within normal limits Gait & Station:  normal Patient leans: N/A  Psychiatric Specialty Exam: Review of Systems  Musculoskeletal: Positive for myalgias and back pain.  Psychiatric/Behavioral: Positive for depression. Negative for suicidal ideas, hallucinations, memory loss and substance abuse. The patient is nervous/anxious and has insomnia.     Blood pressure 140/76, pulse 64, temperature 97.4 F (36.3 C), temperature source Oral, resp. rate 16, height 5' 10"  (1.778 m), weight 193 lb (87.544 kg), SpO2 100 %.Body mass index is 27.69 kg/(m^2).  General Appearance: fairly groomed   Engineer, water::  Good  Speech:  Clear and Coherent  Volume:  Normal  Mood:  Depressed   Affect:  Constricted, but reactive, smiles briefly at times   Thought Process:  Coherent, Goal Directed and Intact  Orientation:  Full (Time, Place, and Person)  Thought Content:  Ruminative about pain issues, denies hallucinations, no delusions   Suicidal Thoughts:  No denies suicidal or self injurious ideations, denies any homicidal ideations  Homicidal Thoughts:  No  Memory:  Immediate;   Good Recent;   Good Remote;   Good  Judgement:  Improving   Insight:  Fair  Psychomotor Activity:  Decreased   Concentration:  Good  Recall:  Good  Fund of Knowledge:Fair  Language: Good  Akathisia:  Negative  Handed:  Right  AIMS (if indicated):     Assets:  Communication Skills Desire for  Improvement Financial Resources/Insurance Leisure Time Physical Health Resilience Social Support Vocational/Educational  ADL's:  Intact  Cognition: WNL  Sleep:  Number of Hours: 6.75  Assessment - patient presents depressed, somewhat anxious, but reporting partial improvement, also reports feeling better as day progresses. Attributes some of her depression to chronic back pain. No suicidal ideations .  Treatment Plan Summary: Daily contact with patient to assess and evaluate symptoms and progress in treatment and Medication management Continue to encourage group participation , milieu to work on coping skills and symptom reduction Continue Cymbalta   30 mg  QDAY for depression, chronic pain  Continue  Remeron 7.5 mg QHS for insomnia and depression Continue Vistaril 25 mg Q 6 hr  PRN Anxiety as needed  Continue  Voltaren 1% transdermal gel 2 gm to be applied to left hip QID for pain management     Discuss adding Lyrica , slowly titrating, as another option to address chronic pain  Neita Garnet, MD 04/29/2015, 2:34 PM

## 2015-04-29 NOTE — Progress Notes (Signed)
Recreation Therapy Notes  Date: 04.24.2017 Time: 9:30am Location: [address] Group Room   Group Topic: Stress Management  Goal Area(s) Addresses:  Patient will actively participate in stress management techniques presented during session.   Behavioral Response: Engaged, Attentive  Intervention: Stress management techniques  Activity : Deep Breathing, Mindful Breathing and Mindfulness. LRT provided education, instruction and demonstration on practice of  Deep Breathing, Mindful Breathing and Mindfulness. Patient was asked to participate in technique introduced during session.   Education:  Stress Management, Discharge Planning.   Education Outcome: Acknowledges education  Clinical Observations/Feedback: Patient actively engaged in technique introduced, expressed no concerns and demonstrated ability to practice independently post d/c.   Laureen Ochs Shyane Fossum, LRT/CTRS        Lane Hacker 04/29/2015 3:49 PM

## 2015-04-29 NOTE — [facility] Group Notes (Signed)
[facility] LCSW Aftercare Discharge Planning Group Note  04/29/2015  8:45 AM  Participation Quality: Did Not Attend. Patient invited to participate but declined.  Tilden Fossa, MSW, [place] Clinical Social Worker [facility] [phone]

## 2015-04-30 MED ORDER — DULOXETINE HCL 30 MG PO CPEP
ORAL_CAPSULE | ORAL | Status: AC
  Filled 2015-04-30: qty 1

## 2015-04-30 MED ORDER — DULOXETINE HCL 60 MG PO CPEP
60.0000 mg | ORAL_CAPSULE | Freq: Every day | ORAL | Status: DC
  Administered 2015-05-01 – 2015-05-02 (×2): 60 mg via ORAL
  Filled 2015-04-30 (×4): qty 1

## 2015-04-30 NOTE — Tx Team (Signed)
Interdisciplinary Treatment Plan Update (Adult) Date: 04/30/2015   Date: 04/30/2015 9:30am  Progress in Treatment:  Attending groups: No Participating in groups: Yes, when she attends Taking medication as prescribed: Yes  Tolerating medication: Yes  Family/Significant other contact made:Yes, CSW has spoken with daughter Patient understands diagnosis: Continuing to assess Discussing patient identified problems/goals with staff: Yes  Medical problems stabilized or resolved: Yes  Denies suicidal/homicidal ideation: Yes, patient denies Patient has not harmed self or Others: Yes   New problem(s) identified: None identified at this time.   Discharge Plan or Barriers: Patient plans to return home to follow up with outpatient services.   Additional comments:  Patient and CSW reviewed pt's identified goals and treatment plan. Patient verbalized understanding and agreed to treatment plan. CSW reviewed [facility] "Discharge Process and Patient Involvement" Form. Pt verbalized understanding of information provided and signed form.   Reason for Continuation of Hospitalization:  Anxiety Depression Medication stabilization Suicidal ideation  Estimated length of stay: 1-2 days  Review of initial/current patient goals per problem list:   1.  Goal(s): Patient will participate in aftercare plan  Met:  Yes  Target date: 3-5 days from date of admission   As evidenced by: Patient will participate within aftercare plan AEB aftercare provider and housing plan at discharge being identified.  04/25/15: CSW to work with Pt to assess for appropriate discharge plan and faciliate appointments and referrals as needed prior to d/c. 4/25: Goal met. Patient plans to return home to follow up with outpatient services.  2.  Goal (s): Patient will exhibit decreased depressive symptoms and suicidal ideations.  Met:  No  Target date: 3-5 days from date of admission   As evidenced by: Patient will utilize self rating  of depression at 3 or below and demonstrate decreased signs of depression or be deemed stable for discharge by MD. 04/25/15: Pt was admitted with symptoms of depression, rating 10/10. Pt continues to present with flat affect and depressive symptoms.  Pt will demonstrate decreased symptoms of depression and rate depression at 3/10 or lower prior to discharge. 4/24: Patient rates depression at 7, denies SI.   3.  Goal(s): Patient will demonstrate decreased signs and symptoms of anxiety.  Met:  No  Target date: 3-5 days from date of admission   As evidenced by: Patient will utilize self rating of anxiety at 3 or below and demonstrated decreased signs of anxiety, or be deemed stable for discharge by MD 04/25/15: Pt was admitted with increased levels of anxiety and is currently rating those symptoms highly. Pt will demonstrated decreased symptoms of anxiety and rate it at 3/10 prior to d/c. 4/24: Patient rates anxiety at 5.  Attendees: Patient:    Family:    Physician: Dr. Parke Poisson; Dr. Sabra Heck 04/30/2015 9:30 AM  Nursing: Darrol Angel, Elesa Massed, RN 04/30/2015 9:30 AM  Clinical Social Worker: Tilden Fossa, LCSW 04/30/2015 9:30 AM  Other: Maxie Better, LCSW  04/30/2015 9:30 AM  Other:  04/30/2015 9:30 AM  Other: Lars Pinks, Case Manager 04/30/2015 9:30 AM  Other: Larose Kells, NP 04/30/2015 9:30 AM  Other:              Tilden Fossa, LCSW Clinical Social Worker [facility] [phone]

## 2015-04-30 NOTE — Progress Notes (Signed)
D: Pt presents anxious this morning about taking meds. Pt verbalized that she doesn't want to take Cymbalta because it causes her back to burn and that her back isn't burning at this time. After an hour, pt requested to take Cymbalta for back pain. Writer encouraged pt to speak to MD prior to taking meds due to pt ongoing request to stop med due to side effects. Dr. Parke Poisson made aware of pt complaint. Pt observed to have some mild confusion this morning, constantly repeating herself and preoccupied on whether or not to take Cymbalta. Pt is A&O x 4. Pt rates depression 3/10. Anxiety 4/10. Hopeless 4/10. Pt denies suicidal thoughts this morning.  A: Medications administered as ordered per MD. Verbal support provided. Pt encouraged to attend groups. 15 minute checks performed for safety.  R: Pt receptive to tx. Pt stated goal "to get well".

## 2015-04-30 NOTE — Progress Notes (Addendum)
Patient ID: Katherine Frye, female   DOB: 03/16/1945, 69 y.o.   MRN: [id number] [facility] MD Progress Note  04/30/2015 1:03 PM [facility]  MRN:  [id number] Subjective:  Today patient states she is feeling " a little bit better " States her chronic pain ,which she describes as a tingling burning sensation on lower back, is persistent, but today less focused on this issue . Denies medication side effects at this time . Objective : Patient case reviewed with staff and patient seen. Patient continues to present depressed, but does endorse some improvement and states her mood is " a little better " today. Denies suicidal ideations at this time. She is tolerating medications well - of note, she had been reporting concern that Cymbalta , which she has been on for several weeks , was possibly causing or worsening her chronic back pain. Today, however, states " I don't think it is the Cymbalta, I think the pain was there even before I started it , and I hear it helps pain rather than cause it. I think it is starting to help". As such , she does not want to taper off Cymbalta at this time.  Patient reports some subjective sense of cognitive decline . We did a MMSE , she scored 26/30, having difficulty primarily in visuo-spatial tasks such as copying diagram.She was fully oriented x 3.  Recall 3/3 immediate and 2/3 at 3 minutes .  Visible on unit . Going to some groups . Behavior on unit in good control . At this time denies suicidal plan or intention and able to contract for safety on unit .   Principal Problem: Major depression, single episode Diagnosis:   Patient Active Problem List   Diagnosis Date Noted  . Major depressive disorder, single episode, severe without psychotic features ([facility]) [F32.2]   . MDD (major depressive disorder), single episode, severe , no psychosis ([facility]) [F32.2]   . Left hip pain [M25.552]   . Generalized anxiety disorder [F41.1] 04/24/2015  . Major depression, single episode  [F32.9] 04/24/2015  . Anxiety [F41.9]   . Depression [F32.9] 03/19/2015  . Anxiety state [F41.1] 03/19/2015  . Insomnia [G47.00] 03/19/2015  . Dizziness [R42] 03/19/2015  . Transient alteration of awareness [R40.4] 03/19/2015  . Tremor [R25.1] 06/05/2014  . Memory loss [R41.3] 06/05/2014  . Cancer ([facility]) [C80.1]   . Malignant neoplasm of upper-inner quadrant of female breast ([facility]) [C50.219]    Total Time spent with patient: 25 minutes   Past Psychiatric History: See H & P  Past Medical History:  Past Medical History  Diagnosis Date  . Allergy 2010    medication and seasonal  . Special screening for malignant neoplasms, colon 2013  . Obesity, unspecified 2013  . Cancer ([facility]) 2010    right breast  . Malignant neoplasm of upper-inner quadrant of female breast ([facility]) 2010  . Solitary cyst of breast 2012    left    Past Surgical History  Procedure Laterality Date  . Colonoscopy  2010    Dr. Gupta, [place]  . Upper gi endoscopy  2010  . Foot surgery  1996  . Tonsillectomy    . Appendectomy    . Nasal sinus surgery  1991  . Skin cancer excision  2013    basel cell on forehead  . Knee surgery Right 2013    replacement  . Knee arthroscopy Right 2012    May and October  . Tubal ligation    . Breast surgery Right   2010    T2, N0, ER/PR positive, HER-2/neu negative, Oncotype recurrence score  of 10 ( 7% risk of recurrence)   . Breast cyst excision Right 2006    fibroid cyst  . Breast biopsy Right 2010   Family History:  Family History  Problem Relation Age of Onset  . Lymphoma Sister     non-hodgkin  . Cancer Sister     cancer of thyroid  . Cancer Other     family history of colon breast and ovarian cancer, no relationships listed   Family Psychiatric  History: See H & P Social History:  History  Alcohol Use No     History  Drug Use No    Social History   Social History  . Marital Status: Married    Spouse Name: Zaylynn Rickett "Vic"  . Number of Children: 2   . Years of Education: 12   Social History Main Topics  . Smoking status: Never Smoker   . Smokeless tobacco: Never Used  . Alcohol Use: No  . Drug Use: No  . Sexual Activity: Not Asked   Other Topics Concern  . None   Social History Narrative   Lives at home with husband.   Caffeine use: Drinks 1 cup coffee per day (sometimes 2 cups per day)      Additional Social History:   Sleep: improving   Appetite:  Fair  Current Medications: Current Facility-Administered Medications  Medication Dose Route Frequency Provider Last Rate Last Dose  . calcium-vitamin D (OSCAL WITH D) 500-200 MG-UNIT per tablet 2 tablet  2 tablet Oral Daily Delfin Gant, NP   2 tablet at 04/30/15 [phone]  . cholecalciferol (VITAMIN D) tablet 7,000 Units  7,000 Units Oral Daily Delfin Gant, NP   7,000 Units at 04/30/15 [phone]  . diclofenac sodium (VOLTAREN) 1 % transdermal gel 2 g  2 g Topical QID Shuvon B Rankin, NP   2 g at 04/30/15 1200  . [START ON 05/01/2015] DULoxetine (CYMBALTA) DR capsule 60 mg  60 mg Oral Daily Myer Peer Sabra Sessler, MD      . hydrOXYzine (ATARAX/VISTARIL) tablet 25 mg  25 mg Oral BID PC Delfin Gant, NP   25 mg at 04/30/15 0813  . ibuprofen (ADVIL,MOTRIN) tablet 400 mg  400 mg Oral Q6H PRN Delfin Gant, NP   400 mg at 04/28/15 0605  . loperamide (IMODIUM) capsule 2 mg  2 mg Oral Q6H PRN Encarnacion Slates, NP   2 mg at 04/28/15 1333  . LORazepam (ATIVAN) tablet 1 mg  1 mg Oral Q6H PRN Delfin Gant, NP   1 mg at 04/28/15 0001  . mirtazapine (REMERON) tablet 7.5 mg  7.5 mg Oral QHS Shuvon B Rankin, NP   7.5 mg at 04/29/15 2215  . multivitamin with minerals tablet 1 tablet  1 tablet Oral Daily Delfin Gant, NP   1 tablet at 04/30/15 5885  . vitamin B-12 (CYANOCOBALAMIN) tablet 1,000 mcg  1,000 mcg Oral Daily Delfin Gant, NP   1,000 mcg at 04/30/15 0813    Lab Results:  No results found for this or any previous visit (from the past 48 hour(s)).  Blood  Alcohol level:  Lab Results  Component Value Date   ETH <5 04/24/2015    Physical Findings: AIMS: Facial and Oral Movements Muscles of Facial Expression: None, normal Lips and Perioral Area: None, normal Jaw: None, normal Tongue: None, normal,Extremity Movements Upper (arms, wrists, hands, fingers): None,  normal Lower (legs, knees, ankles, toes): None, normal, Trunk Movements Neck, shoulders, hips: None, normal, Overall Severity Severity of abnormal movements (highest score from questions above): None, normal Incapacitation due to abnormal movements: None, normal Patient's awareness of abnormal movements (rate only patient's report): No Awareness, Dental Status Current problems with teeth and/or dentures?: No Does patient usually wear dentures?: No  CIWA:    COWS:     Musculoskeletal: Strength & Muscle Tone: within normal limits Gait & Station: normal Patient leans: N/A  Psychiatric Specialty Exam: Review of Systems  Musculoskeletal: Positive for myalgias and back pain.  Psychiatric/Behavioral: Positive for depression. Negative for suicidal ideas, hallucinations, memory loss and substance abuse. The patient is nervous/anxious and has insomnia.   reports chronic lower back pain , as above  Blood pressure 100/72, pulse 70, temperature 98.1 F (36.7 C), temperature source Oral, resp. rate 17, height 5' 10" (1.778 m), weight 193 lb (87.544 kg), SpO2 100 %.Body mass index is 27.69 kg/(m^2).  General Appearance: fairly groomed   Engineer, water::  Good  Speech:  Clear and Coherent  Volume:  Normal  Mood:  Remains depressed, but states feeling " a little better "   Affect:  Constricted, but does smile briefly at times   Thought Process:  Coherent, Goal Directed and Intact  Orientation:  Full (Time, Place, and Person)  Thought Content:  Today less intensely ruminative about pain issues, denies hallucinations, no delusions   Suicidal Thoughts:  No denies suicidal or self injurious  ideations, denies any homicidal ideations  Homicidal Thoughts:  No  Memory:  Immediate;   Good Recent;   Good Remote;   Good  Judgement:  Improving   Insight:  Fair  Psychomotor Activity:  Decreased   Concentration:  Good  Recall:  Good  Fund of Knowledge:Fair  Language: Good  Akathisia:  Negative  Handed:  Right  AIMS (if indicated):     Assets:  Communication Skills Desire for Improvement Financial Resources/Insurance Leisure Time Physical Health Resilience Social Support Vocational/Educational  ADL's:  Intact  Cognition: WNL  Sleep:  Number of Hours: 5.5  Assessment - patient remains depressed, anxious, although endorses some subjective improvement of mood and today less intensely ruminative about her chronic back pain. Of note, states she does not think Cymbalta is causing or contributing to her chronic pain, which had been a concern she has been expressing . Today, expresses hope that Cymbalta may be starting to work for her pain, depression .  Denies suicidal ideations.  Treatment Plan Summary: Daily contact with patient to assess and evaluate symptoms and progress in treatment and Medication management Continue to encourage group participation , milieu to work on coping skills and symptom reduction Increase Cymbalta to 60 mgrs  QDAY for depression, chronic pain  Continue  Remeron 7.5 mg QHS for insomnia and depression Continue Vistaril 25 mg Q 6 hr  PRN Anxiety as needed  Continue  Voltaren 1% transdermal gel 2 gm to be applied to left hip QID for pain management     Treatment team working on discharge planning  Of note, patient had Anti ANA ordered prior to her admission- result is positive. Encouraged to follow up with her PCP ( Dr. [name])  for follow up and treatment .  Neita Garnet, MD 04/30/2015, 1:03 PM

## 2015-04-30 NOTE — Progress Notes (Signed)
D:Patient in the hallway on approach.  Patient states she had a good day.  Patient states the burning in her back has not been a problem for her today.  Patient denies SI/HI and denies AVH.   A: Staff to monitor Q 15 mins for safety.  Encouragement and support offered.  Scheduled medications administered per orders.   R: Patient remains safe on the unit.  Patient attended group tonight.  Patient visible on the unit and interacting with peers.  Patient taking administered mediations.

## 2015-04-30 NOTE — Progress Notes (Signed)
Adult Psychoeducational Group Note  Date:  04/30/2015 Time:  9:08 PM  Group Topic/Focus:  Wrap-Up Group:   The focus of this group is to help patients review their daily goal of treatment and discuss progress on daily workbooks.  Participation Level:  Active  Participation Quality:  Attentive  Affect:  Appropriate  Cognitive:  Appropriate  Insight: Appropriate  Engagement in Group:  Engaged  Modes of Intervention:  Discussion  Additional Comments:  Pt had a good day and looking forward to possibly discharging tomorrow.   Katherine Frye 04/30/2015, 9:08 PM

## 2015-05-01 ENCOUNTER — Telehealth: Payer: Self-pay | Admitting: Neurology

## 2015-05-01 NOTE — Progress Notes (Addendum)
Patient ID: Katherine Frye, female   DOB: 14-Jul-1945, 70 y.o.   MRN: [id number] [facility] MD Progress Note  05/01/2015 12:32 PM [name]  MRN:  [id number] Subjective:  Patient reports she is still depressed, anxious, but feeling better today . She remains ruminative about medication issues, but less so than before. She had expressed concern that her chronic back pain had been exacerbated or caused by Cymbalta, but at this time she no longer thinks so, and states she is optimistic and feeling more hopeful that Cymbalta trial will help. She has now taken 60 mgr dose x 2 days and does not endorse any worsening pain or any other significant side effect. Denies any suicidal ideations, but stats she still feels depressed,sad, although to lesser degree . Objective : Patient case reviewed with staff and patient seen. Patient less severely depressed, remains constricted and ruminative, but to lesser degree ,and in particular more optimistic and less ruminative about her chronic pain and about antidepressant trial. At her request and with her express consent we spoke on the phone with her daughter [name], [place]. Daughter states that depression, anxiety, and back pain predate Cymbalta trial. She states patient has been ambivalent about medications , particularly Cymbalta, due to concerns about side effects, and feels it is positive that patient now less focused on these concerns and more motivated in ongoing antidepressant trial. We reviewed, with patient and daughter via conference call , current meds, disposition recommendations, which would include to follow up with psychiatrist, possible IOP, and to follow up with PCP and with Neurologist. Patient may also benefit from neuropsychological testing to establish baseline,  as there is concern of incipient cognitive decline/difficulties, which may be partially related to depression .  Going to groups. No disruptive or agitated behaviors on unit  .     Principal Problem: Major depression, single episode Diagnosis:   Patient Active Problem List   Diagnosis Date Noted  . Major depressive disorder, single episode, severe without psychotic features ([place]) [F32.2]   . MDD (major depressive disorder), single episode, severe , no psychosis ([place]) [F32.2]   . Left hip pain [M25.552]   . Generalized anxiety disorder [F41.1] 04/24/2015  . Major depression, single episode [F32.9] 04/24/2015  . Anxiety [F41.9]   . Depression [F32.9] 03/19/2015  . Anxiety state [F41.1] 03/19/2015  . Insomnia [G47.00] 03/19/2015  . Dizziness [R42] 03/19/2015  . Transient alteration of awareness [R40.4] 03/19/2015  . Tremor [R25.1] 06/05/2014  . Memory loss [R41.3] 06/05/2014  . Cancer ([place]) [C80.1]   . Malignant neoplasm of upper-inner quadrant of female breast ([place]) [C50.219]    Total Time spent with patient: 25 minutes   Past Psychiatric History: See H & P  Past Medical History:  Past Medical History  Diagnosis Date  . Allergy 2010    medication and seasonal  . Special screening for malignant neoplasms, colon 2013  . Obesity, unspecified 2013  . Cancer [facility]) 2010    right breast  . Malignant neoplasm of upper-inner quadrant of female breast ([place]) 2010  . Solitary cyst of breast 2012    left    Past Surgical History  Procedure Laterality Date  . Colonoscopy  2010    Dr. Lyndel Safe, [place]  . Upper gi endoscopy  2010  . Foot surgery  1996  . Tonsillectomy    . Appendectomy    . Nasal sinus surgery  1991  . Skin cancer excision  2013    basel cell on forehead  .  Knee surgery Right 2013    replacement  . Knee arthroscopy Right 2012    May and October  . Tubal ligation    . Breast surgery Right 2010    T2, N0, ER/PR positive, HER-2/neu negative, Oncotype recurrence score  of 10 ( 7% risk of recurrence)   . Breast cyst excision Right 2006    fibroid cyst  . Breast biopsy Right 2010   Family History:  Family History  Problem  Relation Age of Onset  . Lymphoma Sister     non-hodgkin  . Cancer Sister     cancer of thyroid  . Cancer Other     family history of colon breast and ovarian cancer, no relationships listed   Family Psychiatric  History: See H & P Social History:  History  Alcohol Use No     History  Drug Use No    Social History   Social History  . Marital Status: Married    Spouse Name: [name] "Vic"  . Number of Children: 2  . Years of Education: 12   Social History Main Topics  . Smoking status: Never Smoker   . Smokeless tobacco: Never Used  . Alcohol Use: No  . Drug Use: No  . Sexual Activity: Not Asked   Other Topics Concern  . None   Social History Narrative   Lives at home with husband.   Caffeine use: Drinks 1 cup coffee per day (sometimes 2 cups per day)      Additional Social History:   Sleep: improving   Appetite:   Improved   Current Medications: Current Facility-Administered Medications  Medication Dose Route Frequency Provider Last Rate Last Dose  . calcium-vitamin D (OSCAL WITH D) 500-200 MG-UNIT per tablet 2 tablet  2 tablet Oral Daily Delfin Gant, NP   2 tablet at 05/01/15 0810  . cholecalciferol (VITAMIN D) tablet 7,000 Units  7,000 Units Oral Daily Delfin Gant, NP   7,000 Units at 05/01/15 0809  . diclofenac sodium (VOLTAREN) 1 % transdermal gel 2 g  2 g Topical QID Shuvon B Rankin, NP   2 g at 05/01/15 1203  . DULoxetine (CYMBALTA) DR capsule 60 mg  60 mg Oral Daily Jenne Campus, MD   60 mg at 05/01/15 0809  . hydrOXYzine (ATARAX/VISTARIL) tablet 25 mg  25 mg Oral BID PC Delfin Gant, NP   25 mg at 05/01/15 0810  . ibuprofen (ADVIL,MOTRIN) tablet 400 mg  400 mg Oral Q6H PRN Delfin Gant, NP   400 mg at 04/28/15 0605  . loperamide (IMODIUM) capsule 2 mg  2 mg Oral Q6H PRN Encarnacion Slates, NP   2 mg at 04/28/15 1333  . LORazepam (ATIVAN) tablet 1 mg  1 mg Oral Q6H PRN Delfin Gant, NP   1 mg at 04/28/15 0001  .  mirtazapine (REMERON) tablet 7.5 mg  7.5 mg Oral QHS Shuvon B Rankin, NP   7.5 mg at 04/30/15 2217  . multivitamin with minerals tablet 1 tablet  1 tablet Oral Daily Delfin Gant, NP   1 tablet at 05/01/15 0810  . vitamin B-12 (CYANOCOBALAMIN) tablet 1,000 mcg  1,000 mcg Oral Daily Delfin Gant, NP   1,000 mcg at 05/01/15 2119    Lab Results:  No results found for this or any previous visit (from the past 48 hour(s)).  Blood Alcohol level:  Lab Results  Component Value Date   [facility] <5 04/24/2015  Physical Findings: AIMS: Facial and Oral Movements Muscles of Facial Expression: None, normal Lips and Perioral Area: None, normal Jaw: None, normal Tongue: None, normal,Extremity Movements Upper (arms, wrists, hands, fingers): None, normal Lower (legs, knees, ankles, toes): None, normal, Trunk Movements Neck, shoulders, hips: None, normal, Overall Severity Severity of abnormal movements (highest score from questions above): None, normal Incapacitation due to abnormal movements: None, normal Patient's awareness of abnormal movements (rate only patient's report): No Awareness, Dental Status Current problems with teeth and/or dentures?: No Does patient usually wear dentures?: No  CIWA:    COWS:     Musculoskeletal: Strength & Muscle Tone: within normal limits Gait & Station: normal Patient leans: N/A  Psychiatric Specialty Exam: Review of Systems  Musculoskeletal: Positive for myalgias and back pain.  Psychiatric/Behavioral: Positive for depression. Negative for suicidal ideas, hallucinations, memory loss and substance abuse. The patient is nervous/anxious and has insomnia.   reports chronic lower back pain , as above  Blood pressure 105/58, pulse 70, temperature 98.1 F (36.7 C), temperature source Oral, resp. rate 17, height _0  (1.778 m), weight 193 lb (87.544 kg), SpO2 100 %.Body mass index is 27.69 kg/(m^2).  General Appearance:  Improved grooming   Eye Contact::   Good  Speech:  Clear and Coherent  Volume:  Normal  Mood:  Gradually improving mood, less severely depressed, less ruminative  Affect:  Less constricted   Thought Process:  Coherent, Goal Directed and Intact  Orientation:  Full (Time, Place, and Person)  Thought Content:  Today less intensely ruminative about pain issues, denies hallucinations, no delusions   Suicidal Thoughts:  No denies suicidal or self injurious ideations, denies any homicidal ideations  Homicidal Thoughts:  No  Memory:  Immediate;   Good Recent;   Good Remote;   Good  Judgement:  Improving   Insight:  Fair  Psychomotor Activity:  Improving, visible on unit   Concentration:  Good  Recall:  Good  Fund of Knowledge:Fair  Language: Good  Akathisia:  Negative  Handed:  Right  AIMS (if indicated):     Assets:  Communication Skills Desire for Improvement Financial Resources/Insurance Leisure Time Physical Health Resilience Social Support Vocational/Educational  ADL's:  Intact  Cognition: WNL  Sleep:  Number of Hours: 5.75  Assessment - patient's mood and range of affect are gradually improving . Today expressing less concerns about cymbalta trial, possible side effects, and expressing more optimism and hope about improvement . Less focused on chronic pain, which daughter corroborates predated Cymbalta trial.    Treatment Plan Summary: Daily contact with patient to assess and evaluate symptoms and progress in treatment and Medication management Continue to encourage group participation , milieu to work on coping skills and symptom reduction Continue Cymbalta  60 mgrs  QDAY for depression, chronic pain  Continue  Remeron 7.5 mg QHS for insomnia and depression Continue Vistaril 25 mg Q 6 hr  PRN Anxiety as needed  Continue  Voltaren 1% transdermal gel 2 gm to be applied to left hip QID for pain management     Treatment team working on discharge planning  Of note, patient had Anti ANA ordered prior to her  admission- result is positive. Encouraged to follow up with her PCP ( Dr. [name])  for follow up and treatment .- With patient's consent I have left a message for Dr. [name] to discuss case .  Neita Garnet, MD 05/01/2015, 12:32 PM

## 2015-05-01 NOTE — Clinical Social Work Note (Signed)
CSW spoke with daughter Dortha Kern [phone] to update on patient's progress.  Tilden Fossa, LCSW Clinical Social Worker [facility] [phone]

## 2015-05-01 NOTE — Telephone Encounter (Signed)
Daughter Agustina Caroli called, states she just found out from another Doctor, that a antinuclear antibody test was done and came back positive. They were unaware that this test was done. Would like to speak to someone about this.

## 2015-05-01 NOTE — Telephone Encounter (Signed)
Called daughter, Jocelyn Lamer back. Advised that it looks like lab work was completed in the hospital last Friday, ordered by Dr Sabra Heck. She confirmed pt in hospital an [facility] and was admitted to behavioral health. She is still inpatient there. She stated Dr Suzy Bouchard is the one who told her our office ordered the lab work. I advised we did not and advised her to call where pt is admitted and see if physician can discuss these results with her. She verbalized understanding. I also went over previous EEG results per Dr [name] note per her request. I also advised back on 06/05/14 her mother had Luckey done in office which stood for [facility]. Assessment and received a 25/30. Daughter states they repeated test in hospital and states she got a 26/30. I explained what results meant. Severe memory loss is considered to be around a 10 or 11 per Dr [name]. She verbalized understanding and will call back if she has further questions or concerns.

## 2015-05-01 NOTE — Progress Notes (Signed)
Recreation Therapy Notes  Date: 04.26.2017 Time: 9:30am Location: 300 Hall Group Room   Group Topic: Stress Management  Goal Area(s) Addresses:  Patient will actively participate in stress management techniques presented during session.   Behavioral Response: Engaged, Attentive  Intervention: Stress management techniques  Activity : Deep Breathing and Guided Imagery. LRT provided education, instruction and demonstration on practice of Deep Breathing and Guided Imagery. Patient was asked to participate in technique introduced during session.   Education:  Stress Management, Discharge Planning.   Education Outcome: Acknowledges education  Clinical Observations/Feedback: Patient actively engaged in technique introduced, expressed no concerns and demonstrated ability to practice independently post d/c.   Laureen Ochs Rashea Hoskie, LRT/CTRS        Linsy Ehresman L 05/01/2015 2:28 PM

## 2015-05-01 NOTE — Telephone Encounter (Signed)
Spoke to daughter, discussed that we did not perform the ANA but I do recommend follow up with rheumatology outpatient and recommend asking the inpatient doctor if he would place referral if he/she thinks it is clinically warranted before she is discharged to help facilitate an appointment. Discussed the EEG, no epileptiform activity but did see slowing as I explained to her mother and the different causes of slowing. We can repeat EEG. Discussed mother's back pain, I need to evaluate in the office as we have never discussed with her before. We can further discuss patient's other symptoms as well and make a plan to further evaluate. We can also discuss MoCA again next week.  Terrence Dupont, need to squeeze patient in this week, can you add patient onto my schedule Friday at 11 or 11:30 for an hour - this would be the best.  If that doesn't work then 4:30pm one day please like Thursday this week? She knows I am squeezing her in. Friday would be the best. Thanks.

## 2015-05-01 NOTE — Progress Notes (Signed)
D: Pt presents with flat affect and anxious mood. Pt A & O x 4. Pt continues to have some mild confusion and ask repetitive questions. Pt reports decreased depression 4/10. Anxiety 3/10. Hopeless 2/10. Pt less fixated on Cymbalta causing her back to burn today. Pt reported that the Voltaren gel helps to relieve the burning sensation. Pt requesting to speak with CSW in regards to d/c plans. CSW made aware. Pt reports sleeping well last night but verbalized that she's increasingly tired this morning.  A: Medications administered as ordered per MD. Verbal support provided. Pt encouraged to attend groups. 15 minute checks performed for safety. R: Pt stated goal "to go home". Pt receptive to tx.

## 2015-05-01 NOTE — Progress Notes (Signed)
D:Patient in the hallway on approach.  Patient states she has a good day.  Patient states she is getting use to her medications.  Patient states she feels much better.  Patient denies SI/HI and denies AVH. A: Staff to monitor Q 15 mins for safety.  Encouragement and support offered.  Scheduled medications administered per orders. R: Patient remains safe on the unit.  Patient attended group tonight.  Patient visible on the unit and interacting with peers.  Patient taking administered medications.

## 2015-05-01 NOTE — Telephone Encounter (Signed)
Dr Ahern- FYI 

## 2015-05-02 NOTE — [facility] Group Notes (Signed)
The focus of this group is to educate the patient on the purpose and policies of crisis stabilization and provide a format to answer questions about their admission.  The group details unit policies and expectations of patients while admitted.  Patient attended 0900 nurse education orientation group this morning.  Patient actively participated and had appropriate affect.  Patient is alert.  Patient had appropriate insight and appropriate engagement.  Today patient will work on 3 goals for discharge.  

## 2015-05-02 NOTE — Progress Notes (Addendum)
Patient ID: Katherine Frye, female   DOB: Mar 20, 1945, 70 y.o.   MRN: [id number] [facility] MD Progress Note  05/02/2015 12:30 PM Katherine Frye  MRN:  [id number] Subjective:   Patient reports she is feeling better, less severely depressed, less anxious. She reports her lower back pain has been improving and is less severe. At this time does not appear to be in any acute distress/discomfort . She reports a subjective sense of nausea, " feeling shaky"  For about two hours after she takes the Cymbalta in AM, although this is reported as improving in the context of having shorter periods of discomfort after taking the medication. Denies any vomiting .  Objective : Patient case reviewed with staff and patient seen. Patient is improving gradually, at this time less severely depressed, presents with a  fuller range of affect, smiling and even laughing briefly at times, denies any suicidal ideations , is future oriented and focused on disposition planning /recommendations. Patient and family report frustration regarding chronicity of symptoms, lack of definitive diagnosis or established etiology for her chronic back pain . Patient reports, however, that she is feeling more optmisitic because she feels pain may be starting to subside  At her request, I spoke with her daughter [name] via phone to review progress, discharge recommendations . I have also , at patient's request and with her express consent , discussed case, current treatment, lab findings  with Dr. Carolanne Grumbling, her PCP.  Going to groups. No disruptive or agitated behaviors on unit . Both Dr. Carolanne Grumbling and daughters report that patient's chronic pain predated Cymbalta trial. Patient had initially reported concern that Cymbalta was causing or exacerbating the pain, but at this time she feels that it may be starting to help it, and also feels it is helping her depression. She  states she is feeling more comfortable with continuing this antidepressant trial, in spite  of reported time limited nausea after taking it . We also discussed option of switching to QHS dosing in order to address side effects.  Discussed finding of (+) ANA with patient, daughter and Dr. Carolanne Grumbling. We reviewed importance of following up on this finding, and suggested she see a Rheumatologist as well .  Dr. Carolanne Grumbling will see her next week. As patient reports a subjective sense of slight cognitive decline , and MMSE 26/30, we discussed potential that this could be related to depression, anxiety, but that she should continue seeing her neurologist, Dr. [name], and also consider neuro-psychological testing to obtain baseline .      Principal Problem: Major depression, single episode Diagnosis:   Patient Active Problem List   Diagnosis Date Noted  . Major depressive disorder, single episode, severe without psychotic features ([place]) [F32.2]   . MDD (major depressive disorder), single episode, severe , no psychosis ([place]) [F32.2]   . Left hip pain [M25.552]   . Generalized anxiety disorder [F41.1] 04/24/2015  . Major depression, single episode [F32.9] 04/24/2015  . Anxiety [F41.9]   . Depression [F32.9] 03/19/2015  . Anxiety state [F41.1] 03/19/2015  . Insomnia [G47.00] 03/19/2015  . Dizziness [R42] 03/19/2015  . Transient alteration of awareness [R40.4] 03/19/2015  . Tremor [R25.1] 06/05/2014  . Memory loss [R41.3] 06/05/2014  . Cancer ([place]) [C80.1]   . Malignant neoplasm of upper-inner quadrant of female breast ([place]) [C50.219]    Total Time spent with patient: 40 minutes- more than 50 % of time spent on disposition planning, counseling   Past Psychiatric History: See H & P  Past  Medical History:  Past Medical History  Diagnosis Date  . Allergy 2010    medication and seasonal  . Special screening for malignant neoplasms, colon 2013  . Obesity, unspecified 2013  . Cancer [facility]) 2010    right breast  . Malignant neoplasm of upper-inner quadrant of female breast ([place]) 2010  .  Solitary cyst of breast 2012    left    Past Surgical History  Procedure Laterality Date  . Colonoscopy  2010    Dr. Lyndel Safe, [place]  . Upper gi endoscopy  2010  . Foot surgery  1996  . Tonsillectomy    . Appendectomy    . Nasal sinus surgery  1991  . Skin cancer excision  2013    basel cell on forehead  . Knee surgery Right 2013    replacement  . Knee arthroscopy Right 2012    May and October  . Tubal ligation    . Breast surgery Right 2010    T2, N0, ER/PR positive, HER-2/neu negative, Oncotype recurrence score  of 10 ( 7% risk of recurrence)   . Breast cyst excision Right 2006    fibroid cyst  . Breast biopsy Right 2010   Family History:  Family History  Problem Relation Age of Onset  . Lymphoma Sister     non-hodgkin  . Cancer Sister     cancer of thyroid  . Cancer Other     family history of colon breast and ovarian cancer, no relationships listed   Family Psychiatric  History: See H & P Social History:  History  Alcohol Use No     History  Drug Use No    Social History   Social History  . Marital Status: Married    Spouse Name: Taylyn Brame "Vic"  . Number of Children: 2  . Years of Education: 12   Social History Main Topics  . Smoking status: Never Smoker   . Smokeless tobacco: Never Used  . Alcohol Use: No  . Drug Use: No  . Sexual Activity: Not Asked   Other Topics Concern  . None   Social History Narrative   Lives at home with husband.   Caffeine use: Drinks 1 cup coffee per day (sometimes 2 cups per day)      Additional Social History:   Sleep: improving   Appetite:   Improved   Current Medications: Current Facility-Administered Medications  Medication Dose Route Frequency Provider Last Rate Last Dose  . calcium-vitamin D (OSCAL WITH D) 500-200 MG-UNIT per tablet 2 tablet  2 tablet Oral Daily Delfin Gant, NP   2 tablet at 05/02/15 0845  . cholecalciferol (VITAMIN D) tablet 7,000 Units  7,000 Units Oral Daily Delfin Gant, NP   7,000 Units at 05/02/15 0845  . diclofenac sodium (VOLTAREN) 1 % transdermal gel 2 g  2 g Topical QID Shuvon B Rankin, NP   2 g at 05/02/15 0845  . DULoxetine (CYMBALTA) DR capsule 60 mg  60 mg Oral Daily Jenne Campus, MD   60 mg at 05/02/15 0845  . hydrOXYzine (ATARAX/VISTARIL) tablet 25 mg  25 mg Oral BID PC Delfin Gant, NP   25 mg at 05/02/15 0845  . ibuprofen (ADVIL,MOTRIN) tablet 400 mg  400 mg Oral Q6H PRN Delfin Gant, NP   400 mg at 04/28/15 0605  . loperamide (IMODIUM) capsule 2 mg  2 mg Oral Q6H PRN Encarnacion Slates, NP   2 mg at 04/28/15 1333  .  LORazepam (ATIVAN) tablet 1 mg  1 mg Oral Q6H PRN Delfin Gant, NP   1 mg at 04/28/15 0001  . mirtazapine (REMERON) tablet 7.5 mg  7.5 mg Oral QHS Shuvon B Rankin, NP   7.5 mg at 05/01/15 2148  . multivitamin with minerals tablet 1 tablet  1 tablet Oral Daily Delfin Gant, NP   1 tablet at 05/02/15 0845  . vitamin B-12 (CYANOCOBALAMIN) tablet 1,000 mcg  1,000 mcg Oral Daily Delfin Gant, NP   1,000 mcg at 05/02/15 0845    Lab Results:  No results found for this or any previous visit (from the past 48 hour(s)).  Blood Alcohol level:  Lab Results  Component Value Date   ETH <5 04/24/2015    Physical Findings: AIMS: Facial and Oral Movements Muscles of Facial Expression: None, normal Lips and Perioral Area: None, normal Jaw: None, normal Tongue: None, normal,Extremity Movements Upper (arms, wrists, hands, fingers): None, normal Lower (legs, knees, ankles, toes): None, normal, Trunk Movements Neck, shoulders, hips: None, normal, Overall Severity Severity of abnormal movements (highest score from questions above): None, normal Incapacitation due to abnormal movements: None, normal Patient's awareness of abnormal movements (rate only patient's report): No Awareness, Dental Status Current problems with teeth and/or dentures?: No Does patient usually wear dentures?: No  CIWA:    COWS:      Musculoskeletal: Strength & Muscle Tone: within normal limits Gait & Station: normal Patient leans: N/A  Psychiatric Specialty Exam: Review of Systems  Musculoskeletal: Positive for myalgias and back pain.  Psychiatric/Behavioral: Positive for depression. Negative for suicidal ideas, hallucinations, memory loss and substance abuse. The patient is nervous/anxious and has insomnia.   reports chronic lower back pain , as above Nausea , vague sense of " feeling shaky" x 1-2 hours after taking Cymbalta, denies vomiting   Blood pressure 105/72, pulse 97, temperature 98.1 F (36.7 C), temperature source Oral, resp. rate 16, height _0  (1.778 m), weight 193 lb (87.544 kg), SpO2 100 %.Body mass index is 27.69 kg/(m^2).  General Appearance:  Improved grooming   Eye Contact::  Good  Speech:  Clear and Coherent  Volume:  Normal  Mood:  Partially improved   Affect:   Improving, more reactive    Thought Process:  Coherent, Goal Directed and Intact  Orientation:  Full (Time, Place, and Person)  Thought Content:  Today less ruminative about pain issues, less ruminative about medications and potential side effects, denies hallucinations, no delusions   Suicidal Thoughts:  No denies suicidal or self injurious ideations, denies any homicidal ideations  Homicidal Thoughts:  No  Memory:  Immediate;   Good Recent;   Good Remote;   Good  Judgement:  Improving   Insight:  Improving   Psychomotor Activity:  Improving, visible on unit   Concentration:  Good  Recall:  Good  Fund of Knowledge:Fair  Language: Good  Akathisia:  Negative  Handed:  Right  AIMS (if indicated):     Assets:  Communication Skills Desire for Improvement Financial Resources/Insurance Leisure Time Physical Health Resilience Social Support Vocational/Educational  ADL's:  Intact  Cognition: WNL  Sleep:  Number of Hours: 6.5  Assessment - patient's mood and affect improving ,she is still depressed, but does state she  feels better, and is presenting with fuller range of affect. Denies SI. Patient had been ambivalent about Cymbalta trial, and dosing , regarding her concern that it could be causing /worsening pain. At this time reports being less concerned about  this and states pain is less today in spite of increased dose, and states she feels Cymbalta / Remeron are helping her  Feel better. It is likely that Cymbalta is associated with temporary nausea after she takes it in the AM, but as discussed with patient she wants to continue Cymbalta trial at this time. (+) ANA finding should be followed up with her PCP- patient, daughters,and Dr. Carolanne Grumbling ( PCP ) aware .    Treatment Plan Summary: Daily contact with patient to assess and evaluate symptoms and progress in treatment and Medication management Continue to encourage group participation , milieu to work on coping skills and symptom reduction Continue Cymbalta  60 mgrs  QDAY for depression, chronic pain  Continue  Remeron 7.5 mg QHS for insomnia and depression Continue Vistaril 25 mg Q 6 hr  PRN Anxiety as needed  Continue  Voltaren 1% transdermal gel 2 gm to be applied to left hip QID for pain management     Treatment team working on discharge planning / as above    Neita Garnet, MD 05/02/2015, 12:30 PM

## 2015-05-02 NOTE — BHH Group Notes (Signed)
[place] LCSW Group Therapy 05/02/2015 1:15 PM Type of Therapy: Group Therapy Participation Level: Active  Participation Quality: Attentive, Sharing and Supportive  Affect: Appropriate  Cognitive: Alert and Oriented  Insight: Developing/Improving and Engaged  Engagement in Therapy: Developing/Improving and Engaged  Modes of Intervention: Activity, Clarification, Confrontation, Discussion, Education, Exploration, Limit-setting, Orientation, Problem-solving, Rapport Building, Art therapist, Socialization and Support  Summary of Progress/Problems: Patient was attentive and engaged with speaker from [place]. Patient was attentive to speaker while they shared their story of dealing with mental health and overcoming it. Patient expressed interest in their programs and services and received information on their agency. Patient processed ways they can relate to the speaker.   Tilden Fossa, LCSW Clinical Social Worker [facility] [phone]

## 2015-05-02 NOTE — Telephone Encounter (Signed)
Called daughter, Jocelyn Lamer back. Made f/u for 05/10/15 at 11am for an hour appt per Dr [name] request. Pain a lot worse in the mornings per daughter. Pt cannot sleep well at night per daughter. Since being at inpatient behavioral health at [facility],  they are getting her into a routine and making her get up in the morning per daughter. She states "I appreciate you guys fitting [place] in for an earlier appt". Told her to check in 1045am. She verbalized understanding and wrote appt day and time down.

## 2015-05-02 NOTE — BHH Group Notes (Signed)
Late Entry From 04/30/15 at 1:15pm:  [place] LCSW Group Therapy   Type of Therapy:  Group Therapy  Participation Level:  Active  Participation Quality:  Attentive, Sharing and Supportive  Affect:  Blunted  Cognitive:  Alert and Oriented  Insight:  Developing/Improving and Engaged  Engagement in Therapy:  Developing/Improving and Engaged  Modes of Intervention:  Clarification, Confrontation, Discussion, Education, Exploration, Limit-setting, Orientation, Problem-solving, Rapport Building, Art therapist, Socialization and Support  Summary of Progress/Problems: The topic for group therapy was feelings about diagnosis.  Pt actively participated in group discussion on their past and current diagnosis and how they feel towards this.  Pt also identified how society and family members judge them, based on their diagnosis as well as stereotypes and stigmas.  Patient discussed an experience that she had with a church member that had a mental illness. CSW and other group members provided patient with emotional support and encouragement.   Tilden Fossa, MSW, [place] Clinical Social Worker [facility] [phone]

## 2015-05-02 NOTE — BHH Group Notes (Signed)
Pt attended karaoke group.  Allean Montfort, MHT 

## 2015-05-02 NOTE — Telephone Encounter (Signed)
Dr Ahern- FYI 

## 2015-05-02 NOTE — BHH Group Notes (Signed)
Late Entry From 05/01/15 at 1:15pm:         [place] LCSW Group Therapy Type of Therapy: Group Therapy Participation Level: Minimal  Participation Quality: Attentive  Affect: Blunted  Cognitive: Alert and Oriented  Insight: Developing/Improving and Engaged  Engagement in Therapy: Developing/Improving and Engaged  Modes of Intervention: Clarification, Confrontation, Discussion, Education, Exploration, Limit-setting, Orientation, Problem-solving, Rapport Building, Art therapist, Socialization and Support  Summary of Progress/Problems: The topic for group was balance in life. Today's group focused on defining balance in one's own words, identifying things that can knock one off balance, and exploring healthy ways to maintain balance in life. Group members were asked to provide an example of a time when they felt off balance, describe how they handled that situation,and process healthier ways to regain balance in the future. Group members were asked to share the most important tool for maintaining balance that they learned while at [facility] and how they plan to apply this method after discharge. Patient did not engage in group discussion despite CSW encouragement.   Tilden Fossa, MSW, [place] Clinical Social Worker [facility] [phone]

## 2015-05-02 NOTE — Progress Notes (Signed)
D- Patient is observed in a depressed mood with a flat affect.  Patient brightens on approach.  Patient is observed in the milieu interacting well with peers.  She currently denies SI, HI, and AVH.  Patient has been attending groups and actively participating.  Patient reports that it took her "a long time" to get to sleep last night.  Patient describes her energy as "low".  On patient self-inventory, she rates her depression "5", feelings of hopelessness "2", and her anxiety "2".  No complaints. A- Scheduled medications administered to patient, per MD orders. Support and encouragement provided.  Routine safety checks conducted every 15 minutes.  Patient informed to notify staff with problems or concerns. R- No adverse drug reactions noted. Patient contracts for safety at this time. Patient compliant with medications and treatment plan. Patient receptive, calm, and cooperative.  Patient remains safe at this time.

## 2015-05-02 NOTE — BHH Group Notes (Signed)
Late Entry From 05/01/15 at 8:45am:    [facility] LCSW Aftercare Discharge Planning Group Note   Participation Quality: Alert, Appropriate and Oriented  Mood/Affect: Blunted  Depression Rating: 2-3  Anxiety Rating: 2-3  Thoughts of Suicide: Pt denies SI/HI  Will you contract for safety? Yes  Current AVH: Pt denies  Plan for Discharge/Comments: Pt attended discharge planning group and actively participated in group. CSW provided pt with today's workbook. Patient plans to return home to follow up with outpatient services.   Transportation Means: Pt reports access to transportation  Supports: No supports mentioned at this time  Tilden Fossa, MSW, [employer] Clinical Social Worker [employer] [phone]

## 2015-05-03 MED ORDER — DULOXETINE HCL 60 MG PO CPEP: 60.0000 mg | ORAL_CAPSULE | Freq: Every day | ORAL | Status: DC

## 2015-05-03 MED ORDER — DULOXETINE HCL 60 MG PO CPEP
60.0000 mg | ORAL_CAPSULE | Freq: Every day | ORAL | Status: DC
Start: 2015-05-03 — End: 2015-05-03
  Filled 2015-05-03 (×2): qty 1

## 2015-05-03 MED ORDER — VITAMIN D3 25 MCG (1000 UNIT) PO TABS
7000.0000 [IU] | ORAL_TABLET | Freq: Every day | ORAL | Status: DC
  Filled 2015-05-03 (×2): qty 7

## 2015-05-03 MED ORDER — DICLOFENAC SODIUM 1 % TD GEL: 2.0000 g | Freq: Four times a day (QID) | TRANSDERMAL | Status: DC

## 2015-05-03 MED ORDER — MIRTAZAPINE 7.5 MG PO TABS: 7.5000 mg | ORAL_TABLET | Freq: Every day | ORAL | Status: DC

## 2015-05-03 MED ORDER — HYDROXYZINE HCL 25 MG PO TABS
25.0000 mg | ORAL_TABLET | Freq: Two times a day (BID) | ORAL | Status: DC
Start: 2015-05-03 — End: 2015-05-11

## 2015-05-03 MED ORDER — ADULT MULTIVITAMIN W/MINERALS CH
1.0000 | ORAL_TABLET | Freq: Every day | ORAL | Status: DC
  Filled 2015-05-03 (×2): qty 1

## 2015-05-03 MED ORDER — CALCIUM CARBONATE-VITAMIN D 500-200 MG-UNIT PO TABS
2.0000 | ORAL_TABLET | Freq: Every day | ORAL | Status: DC
  Filled 2015-05-03 (×2): qty 2

## 2015-05-03 MED ORDER — VITAMIN D (CHOLECALCIFEROL) 25 MCG (1000 UT) PO CAPS: 7000.0000 [IU] | ORAL_CAPSULE | Freq: Every day | ORAL | Status: DC

## 2015-05-03 MED ORDER — CALCIUM CARBONATE-VITAMIN D 500-200 MG-UNIT PO TABS: 2.0000 | ORAL_TABLET | Freq: Every day | ORAL | Status: DC

## 2015-05-03 MED ORDER — VITAMIN B-12 1000 MCG PO TABS
1000.0000 ug | ORAL_TABLET | Freq: Every day | ORAL | Status: DC
  Filled 2015-05-03 (×2): qty 1

## 2015-05-03 MED ORDER — VITAMIN B-12 1000 MCG PO TABS: 1000.0000 ug | ORAL_TABLET | Freq: Every day | ORAL | Status: DC

## 2015-05-03 MED ORDER — ADULT MULTIVITAMIN W/MINERALS CH: 1.0000 | ORAL_TABLET | Freq: Every day | ORAL | Status: DC

## 2015-05-03 MED ORDER — CALCIUM-VITAMIN D3 600-400 MG-UNIT PO CAPS: 1.0000 | ORAL_CAPSULE | Freq: Every day | ORAL | Status: DC

## 2015-05-03 NOTE — Progress Notes (Signed)
D: Pt d/c home as per MD's orders. Pt was picked up by her daughter and husband. Denies SI, HI, AVH and pain. Verbalized understanding related to D/C instructions (medications, appointments & prescriptions). Pt signed belonging sheet in agreement with items received from locker 5.  A: Safety maintained on and off unit without self harm gestures or incident. D/C instructions reviewed with pt and daughter. All belongings in locker 5 returned to pt at time of departure from facility.  R: Pt calm, receptive to care. Denies concerns or adverse drug reactions. No physical distress evident at time of d/c.

## 2015-05-03 NOTE — Progress Notes (Signed)
D: Client visible on the unit, reports of her day "better than yesterday" "they letting me go home tomorrow" "issues regarding my back, the pain, well now it's a less than a two" "he gave me a gel for my back and it has helped" "everybody else was saying it was in my mind, but he listen to me and found out I had shingles before and thinks it may be residual from that" A: Writer provided emotional support, medications reviewed and administered. Staff will monitor q70min for safety. R:Client is safe on the unit, attended karaoke.

## 2015-05-03 NOTE — Progress Notes (Signed)
  [facility] Adult Case Management Discharge Plan :  Will you be returning to the same living situation after discharge:  Yes,  patient plans to return home At discharge, do you have transportation home?: Yes,  patient's daughter will provide transport Do you have the ability to pay for your medications: Yes,  patient will be provided with prescriptions at discharge  Release of information consent forms completed and in the chart;  Patient's signature needed at discharge.  Patient to Follow up at: Follow-up Information    Follow up with [facility].   Why:  Please call at discharge to schedule a medication management appointment with Dr. Casimiro Needle. $150 deposit required to schedule initial appointment.    Contact information:   [address] [place], [place], [place] [address] [phone]      Follow up with [facility] On 05/13/2015.   Why:  Therapy appointment with Dr. Vita Barley on Monday may 8th at [facility]. Call office if you need to reschedule appointment.   Contact information:   [address] [place], [place] [address] Phone: [phone] Fax: [phone]      Follow up with [facility] On 05/10/2015.   Why:  Appointment with Dr. [name] on Friday May 5th at 11 am. Call office if you need to reschedule.   Contact information:   [address]     [place] [phone]      Follow up with Dr. Delena Bali On 05/10/2015.   Why:  Hospital follow up appointment for medication management on Friday May 5th at 2pm. Call office if you need to reschedule.    Contact information:   [address] [place], [place] [address] Phone: [phone]      Next level of care provider has access to [place] and Suicide Prevention discussed: Yes,  with patient and daughter  Have you used any form of tobacco in the last 30 days? (Cigarettes, Smokeless Tobacco, Cigars, and/or  Pipes): No  Has patient been referred to the Quitline?: N/A patient is not a smoker  Patient has been referred for addiction treatment: N/A  Katherine Frye, Casimiro Needle 05/03/2015, 11:00 AM

## 2015-05-03 NOTE — BHH Group Notes (Signed)
   [facility] LCSW Aftercare Discharge Planning Group Note  05/03/2015  8:45 AM   Participation Quality: Alert, Appropriate and Oriented  Mood/Affect: Depressed and Flat  Depression Rating: 2-3  Anxiety Rating:  2-3  Thoughts of Suicide: Pt denies SI/HI  Will you contract for safety? Yes  Current AVH: Pt denies  Plan for Discharge/Comments: Pt attended discharge planning group and actively participated in group. CSW provided pt with today's workbook. Patient plans to return home to follow up with outpatient services.    Transportation Means: Pt reports access to transportation  Supports: No supports mentioned at this time  Tilden Fossa, MSW, [employer] Clinical Social Worker [employer] [phone]

## 2015-05-03 NOTE — BHH Suicide Risk Assessment (Signed)
[facility] Discharge Suicide Risk Assessment   Principal Problem: Major depression, single episode Discharge Diagnoses:  Patient Active Problem List   Diagnosis Date Noted  . Major depressive disorder, single episode, severe without psychotic features ([place]) [F32.2]   . MDD (major depressive disorder), single episode, severe , no psychosis ([place]) [F32.2]   . Left hip pain [M25.552]   . Generalized anxiety disorder [F41.1] 04/24/2015  . Major depression, single episode [F32.9] 04/24/2015  . Anxiety [F41.9]   . Depression [F32.9] 03/19/2015  . Anxiety state [F41.1] 03/19/2015  . Insomnia [G47.00] 03/19/2015  . Dizziness [R42] 03/19/2015  . Transient alteration of awareness [R40.4] 03/19/2015  . Tremor [R25.1] 06/05/2014  . Memory loss [R41.3] 06/05/2014  . Cancer ([place]) [C80.1]   . Malignant neoplasm of upper-inner quadrant of female breast ([place]) [C50.219]     Total Time spent with patient: 30 minutes  Musculoskeletal: Strength & Muscle Tone: within normal limits Gait & Station: normal Patient leans: N/A  Psychiatric Specialty Exam: ROS today states her lower back pain is improved, denies nausea at this time  Blood pressure 117/67, pulse 70, temperature 97.4 F (36.3 C), temperature source Oral, resp. rate 16, height 5\' 10"  (1.778 m), weight 193 lb (87.544 kg), SpO2 100 %.Body mass index is 27.69 kg/(m^2).  General Appearance: improved grooming   Eye Contact::  Good  Speech:  Normal Rate409  Volume:  Normal  Mood:  improved, states she is feeling better, less depressed  Affect:  more reactive,  brighter , smiles and even laughs appropriately at times   Thought Process:  Linear  Orientation:  Full (Time, Place, and Person)  Thought Content:  denies hallucinations, no delusions, less ruminative about pain issues   Suicidal Thoughts:  No- denies any suicidal ideations, denies any self injurious ideations, denies any violent or  homicidal ideations   Homicidal Thoughts:  No  Memory:   recent and remote grossly intact   Judgement:  Other:  improving   Insight:  improving   Psychomotor Activity:  Normal  Concentration:  Good  Recall:  Good  Fund of Knowledge:Good  Language: Negative  Akathisia:  Negative  Handed:  Right  AIMS (if indicated):     Assets:  Desire for Improvement Resilience  Sleep:  Number of Hours: 5.5  Cognition: WNL  ADL's:  Intact   Mental Status Per Nursing Assessment::   On Admission:  NA  Demographic Factors:  70 year old female, lives with husband, two adult daughters   Loss Factors: Reports chronic back pain x several weeks to months   Historical Factors: History of depression, history of anxiety   Risk Reduction Factors:   Sense of responsibility to family, Living with another person, especially a relative and Positive social support  Continued Clinical Symptoms:  At this time patient improved compared to admission, presents alert and attentive , improved grooming, improved mood and fuller range of affect, no thought disorder, no SI or HI, no psychotic symptoms, future oriented , stating she feels more hopeful and optimistic about medication trial and further improvement . At this time denies significant medication side effects- has reported some nausea for a period of time after she takes Cymbalta, but states this appears to be improving.  At patient's request , discussed  discharge planning with her daughter, [name] , who will be picking her up later this evening . At patient's request, and with her express consent,  I have discussed case with Dr. Carolanne Grumbling, her PCP, and today with Dr. Cathren Laine (  patient's  Neurologist ) RN  Cognitive Features That Contribute To Risk:  At this time patient fully alert and attentive , oriented x 3   Suicide Risk:  Mild:  Suicidal ideation of limited frequency, intensity, duration, and specificity.  There are no identifiable plans, no associated intent, mild dysphoria and related symptoms, good  self-control (both objective and subjective assessment), few other risk factors, and identifiable protective factors, including available and accessible social support.  Follow-up Information    Follow up with [facility].   Why:  Please call at discharge to schedule a medication management appointment with Dr. Casimiro Needle. $150 deposit required to schedule initial appointment.    Contact information:   [address], [place], [place], [place] [address] [phone]      Follow up with [facility] On 05/13/2015.   Why:  Therapy appointment with Dr. Vita Barley on Monday may 8th at [facility]. Call office if you need to reschedule appointment.   Contact information:   [address] [place], [place] [address] Phone: [phone] Fax: [phone]      Follow up with [facility] On 05/10/2015.   Why:  Appointment with Dr. [name] on Friday May 5th at 11 am. Call office if you need to reschedule.   Contact information:   [address]     [place] [phone]      Follow up with Dr. Delena Bali On 05/10/2015.   Why:  Hospital follow up appointment for medication management on Friday May 5th at 2pm. Call office if you need to reschedule.    Contact information:   [address] [place], [place] [address] Phone: [phone]      Follow up with [facility].   Why:  Please call or consult with your Neurologist if interested in neuropsychological evaluation.    Contact information:   [address] [place]  [place], [place] [address] [phone]    * Patient also encouraged to follow up with a Rheumatologist- may need referral from PCP Plan Of Care/Follow-up recommendations:  Activity:  as tolerated  Diet:  Regular Tests:  NA Other:  see below Patient is leaving unit in good spirits  She plans to return home.  Daughter will pick her up later today. Follow up as above .   Neita Garnet, MD 05/03/2015, 1:54 PM

## 2015-05-03 NOTE — B[facility] Group Notes (Signed)
[place] LCSW Group Therapy 05/03/2015 1:15 PM Type of Therapy: Group Therapy Participation Level: Active  Participation Quality: Attentive, Sharing and Supportive  Affect: Appropriate  Cognitive: Alert and Oriented  Insight: Developing/Improving and Engaged  Engagement in Therapy: Developing/Improving and Engaged  Modes of Intervention: Clarification, Confrontation, Discussion, Education, Exploration, Limit-setting, Orientation, Problem-solving, Rapport Building, Art therapist, Socialization and Support  Summary of Progress/Problems: The topic for today was feelings about relapse. Pt discussed what relapse prevention is to them and identified triggers that they are on the path to relapse. Pt processed their feeling towards relapse and was able to relate to peers. Pt discussed coping skills that can be used for relapse prevention. Patient created a recovery plan in group, establishing coping skills and identifying positive social supports.    Tilden Fossa, MSW, [place] Clinical Social Worker [facility] [phone]

## 2015-05-03 NOTE — Progress Notes (Signed)
Late entry from 05/02/15 3:30pm:  CSW and patient met to discuss her discharge plans and transition back home per daughter Kimberly's request. CSW and patient discussed the importance of having a daily routine and healthy coping skills. Patient was able to generate several suggestions for healthy coping skills & activities to utilize once she returns home.   Tilden Fossa, LCSW Clinical Social Worker [facility] [phone]

## 2015-05-03 NOTE — Tx Team (Signed)
Interdisciplinary Treatment Plan Update (Adult) Date: 05/03/2015   Date: 05/03/2015 9:30am  Progress in Treatment:  Attending groups: Yes Participating in groups: Yes Taking medication as prescribed: Yes  Tolerating medication: Yes  Family/Significant other contact made: Yes, CSW has spoken with both daughter Patient understands diagnosis: Continuing to assess Discussing patient identified problems/goals with staff: Yes  Medical problems stabilized or resolved: Yes  Denies suicidal/homicidal ideation: Yes, patient denies Patient has not harmed self or Others: Yes   New problem(s) identified: None identified at this time.   Discharge Plan or Barriers: Patient plans to return home to follow up with outpatient services.   Additional comments:  Patient and CSW reviewed pt's identified goals and treatment plan. Patient verbalized understanding and agreed to treatment plan. CSW reviewed [facility] "Discharge Process and Patient Involvement" Form. Pt verbalized understanding of information provided and signed form.   Reason for Continuation of Hospitalization:  Anxiety Depression Medication stabilization Suicidal ideation  Estimated length of stay: Discharge anticipated for today 05/03/15  Review of initial/current patient goals per problem list:   1.  Goal(s): Patient will participate in aftercare plan  Met:  Yes  Target date: 3-5 days from date of admission   As evidenced by: Patient will participate within aftercare plan AEB aftercare provider and housing plan at discharge being identified.  04/25/15: CSW to work with Pt to assess for appropriate discharge plan and faciliate appointments and referrals as needed prior to d/c. 4/25: Goal met. Patient plans to return home to follow up with outpatient services.  2.  Goal (s): Patient will exhibit decreased depressive symptoms and suicidal ideations.  Met:  Yes  Target date: 3-5 days from date of admission   As evidenced by: Patient will  utilize self rating of depression at 3 or below and demonstrate decreased signs of depression or be deemed stable for discharge by MD. 04/25/15: Pt was admitted with symptoms of depression, rating 10/10. Pt continues to present with flat affect and depressive symptoms.  Pt will demonstrate decreased symptoms of depression and rate depression at 3/10 or lower prior to discharge. 4/24: Patient rates depression at 7, denies SI.  4/28: Goal met. Patient rates depression at 2-3 today, denies SI.   3.  Goal(s): Patient will demonstrate decreased signs and symptoms of anxiety.  Met:  Yes  Target date: 3-5 days from date of admission   As evidenced by: Patient will utilize self rating of anxiety at 3 or below and demonstrated decreased signs of anxiety, or be deemed stable for discharge by MD 04/25/15: Pt was admitted with increased levels of anxiety and is currently rating those symptoms highly. Pt will demonstrated decreased symptoms of anxiety and rate it at 3/10 prior to d/c.    4/24: Patient rates anxiety at 5.    4/28: Goal met. Patient rates anxiety at 2-3 today.   Attendees: Patient:    Family:    Physician: Dr. Parke Poisson; Dr. Sabra Heck 05/03/2015 9:30 AM  Nursing: Jacqualine Code, RN 05/03/2015 9:30 AM  Clinical Social Worker: Tilden Fossa, LCSW 05/03/2015 9:30 AM  Other: Maxie Better, LCSW  05/03/2015 9:30 AM  Other: Norberto Sorenson, P4CC 05/03/2015 9:30 AM  Other: Lars Pinks, Case Manager 05/03/2015 9:30 AM  Other: Agustina Caroli, May Augustin, NP 05/03/2015 9:30 AM  Other:                Tilden Fossa, LCSW Clinical Social Worker [facility] [place] [phone]

## 2015-05-03 NOTE — Discharge Summary (Signed)
Physician Discharge Summary Note  Patient:  Katherine Frye is an 70 y.o., female MRN:  [id number] DOB:  [birth year] Patient phone:  [phone] (home)  Patient address:   [place] [address],  Total Time spent with patient: Greater than 30 minutes  Date of Admission:  04/24/2015  Date of Discharge: 05-03-15  Reason for Admission:  Depressive Symptoms   Principal Problem: Major depression, single episode  Discharge Diagnoses: Patient Active Problem List   Diagnosis Date Noted  . Major depressive disorder, single episode, severe without psychotic features ([place]) [F32.2]   . MDD (major depressive disorder), single episode, severe , no psychosis ([place]) [F32.2]   . Left hip pain [M25.552]   . Generalized anxiety disorder [F41.1] 04/24/2015  . Major depression, single episode [F32.9] 04/24/2015  . Anxiety [F41.9]   . Depression [F32.9] 03/19/2015  . Anxiety state [F41.1] 03/19/2015  . Insomnia [G47.00] 03/19/2015  . Dizziness [R42] 03/19/2015  . Transient alteration of awareness [R40.4] 03/19/2015  . Tremor [R25.1] 06/05/2014  . Memory loss [R41.3] 06/05/2014  . Cancer ([place]) [C80.1]   . Malignant neoplasm of upper-inner quadrant of female breast ([place]) [C50.219]    Past Psychiatric History: Major depression  Past Medical History:  Past Medical History  Diagnosis Date  . Allergy 2010    medication and seasonal  . Special screening for malignant neoplasms, colon 2013  . Obesity, unspecified 2013  . Cancer [facility]) 2010    right breast  . Malignant neoplasm of upper-inner quadrant of female breast ([place]) 2010  . Solitary cyst of breast 2012    left    Past Surgical History  Procedure Laterality Date  . Colonoscopy  2010    Dr. Lyndel Safe, [place]  . Upper gi endoscopy  2010  . Foot surgery  1996  . Tonsillectomy    . Appendectomy    . Nasal sinus surgery  1991  . Skin cancer excision  2013    basel cell on forehead  . Knee surgery Right 2013    replacement  .  Knee arthroscopy Right 2012    May and October  . Tubal ligation    . Breast surgery Right 2010    T2, N0, ER/PR positive, HER-2/neu negative, Oncotype recurrence score  of 10 ( 7% risk of recurrence)   . Breast cyst excision Right 2006    fibroid cyst  . Breast biopsy Right 2010   Family History:  Family History  Problem Relation Age of Onset  . Lymphoma Sister     non-hodgkin  . Cancer Sister     cancer of thyroid  . Cancer Other     family history of colon breast and ovarian cancer, no relationships listed   Family Psychiatric  History: See H&P  Social History:  History  Alcohol Use No     History  Drug Use No    Social History   Social History  . Marital Status: Married    Spouse Name: Remmy Crass "Vic"  . Number of Children: 2  . Years of Education: 12   Social History Main Topics  . Smoking status: Never Smoker   . Smokeless tobacco: Never Used  . Alcohol Use: No  . Drug Use: No  . Sexual Activity: Not Asked   Other Topics Concern  . None   Social History Narrative   Lives at home with husband.   Caffeine use: Drinks 1 cup coffee per day (sometimes 2 cups per day)  Hospital Course: Katherine Frye is a 70 year old Caucasian female who was admitted to [facility] from the [facility] with complaints of worsening symptoms of depression & anxiety. During this admission assessment, Ingrid reports, "I have been getting this medication for depression for about 2 months now. I have not seen anyone with anxiety to know how it feels. I never drank or use dugs. Never smoked either. I can say that I have been depressed x 1 year since the death of my sister, She died of Lou Garig's disease. She was my closest sibling. I felt lost since her death. Then, I developed this burning pain to my back. I have seen my primary doctor & also a neurologist. They have done all kinds of test with no definite answer for me. My family & friends thinks I'm depressed. I can't cope with  this burning pain to my back. The pain starts in the morning, will not not cease until about 12:00 PM. When this pain starts, I'm unable to do anything other than lying in bed till it clears. My doctor has decided to put me on Cymbalta. But, when I take the Cymbalta, about 20 minutes after, the burning pain will worsen then clears eventually. I had tried Neurontin, it made me feel dizzy, I fell off the bed after I took it. Do you have an answer for me about what has been going on with me?"          Katherine Frye was admitted to the adult 400 unit where she was evaluated and her symptoms were identified. Medication management was discussed and implemented. The patient initially reported feeling that the Cymbalta was making her worse. Due to this the medication was started to be tapered down and Remeron 7.5 mg at bedtime was added for insomnia. Kenia later reported feeling that the Cymbalta was helping her depression once the dose was lowered. She was encouraged to participate in unit programming. Medical problems were identified and treated appropriately. Patient was noted to have a positive ANA test. Dr. Parke Poisson discussed her lab-work with her PCP Dr. Carolanne Grumbling and she was also recommended to see a Rheumatologist. Patient was started on Voltaren 1% Voltaren transdermal gel was started for pain management due to hip discomfort.  Home medication was restarted as needed.  She was evaluated each day by a clinical provider to ascertain the patient's response to treatment.  Improvement was noted by the patient's report of decreasing symptoms, improved sleep and appetite, affect, medication tolerance, behavior, and participation in unit programming.  The patient was asked each day to complete a self inventory noting mood, mental status, pain, new symptoms, anxiety and concerns.         She responded well to medication and being in a therapeutic and supportive environment. According to progress notes from Dr. Parke Poisson the  patient reported decreased symptoms of depression and anxiety. Patient also reported that her lower back pain had been improving and was less severe. Positive and appropriate behavior was noted and the patient was motivated for recovery.  She worked closely with the treatment team and case manager to develop a discharge plan with appropriate goals. Coping skills, problem solving as well as relaxation therapies were also part of the unit programming.         By the day of discharge she was in much improved condition than upon admission.  Symptoms were reported as significantly decreased or resolved completely. The patient denied SI/HI and voiced no AVH.  She was motivated to continue taking medication with a goal of continued improvement in mental health.    SAREENA ODEH was discharged home with a plan to follow up as noted below. Patient was provided with prescriptions for her psychiatric medications upon discharge.   Physical Findings: AIMS: Facial and Oral Movements Muscles of Facial Expression: None, normal Lips and Perioral Area: None, normal Jaw: None, normal Tongue: None, normal,Extremity Movements Upper (arms, wrists, hands, fingers): None, normal Lower (legs, knees, ankles, toes): None, normal, Trunk Movements Neck, shoulders, hips: None, normal, Overall Severity Severity of abnormal movements (highest score from questions above): None, normal Incapacitation due to abnormal movements: None, normal Patient's awareness of abnormal movements (rate only patient's report): No Awareness, Dental Status Current problems with teeth and/or dentures?: No Does patient usually wear dentures?: No  CIWA:    COWS:     Musculoskeletal: Strength & Muscle Tone: within normal limits Gait & Station: normal Patient leans: N/A  Psychiatric Specialty Exam: Review of Systems  Constitutional: Negative.   HENT: Negative.   Eyes: Negative.   Respiratory: Negative.   Cardiovascular: Negative.    Gastrointestinal: Negative.   Genitourinary: Negative.   Musculoskeletal: Negative.   Skin: Negative.   Neurological: Negative.   Endo/Heme/Allergies: Negative.   Psychiatric/Behavioral: Positive for depression (Stable). Negative for suicidal ideas, hallucinations, memory loss and substance abuse. The patient has insomnia (Stable). The patient is not nervous/anxious.     Blood pressure 117/67, pulse 70, temperature 97.4 F (36.3 C), temperature source Oral, resp. rate 16, height 5' 10" (1.778 m), weight 87.544 kg (193 lb), SpO2 100 %.Body mass index is 27.69 kg/(m^2).  See Md's SRA   Have you used any form of tobacco in the last 30 days? (Cigarettes, Smokeless Tobacco, Cigars, and/or Pipes): No  Has this patient used any form of tobacco in the last 30 days? (Cigarettes, Smokeless Tobacco, Cigars, and/or Pipes): No  Blood Alcohol level:  Lab Results  Component Value Date   ETH <5 70/92/9574   Metabolic Disorder Labs:  Lab Results  Component Value Date   HGBA1C 5.9* 04/26/2015   MPG 123 04/26/2015   No results found for: PROLACTIN No results found for: CHOL, TRIG, HDL, CHOLHDL, VLDL, LDLCALC  See Psychiatric Specialty Exam and Suicide Risk Assessment completed by Attending Physician prior to discharge.  Discharge destination:  Home  Is patient on multiple antipsychotic therapies at discharge:  No   Has Patient had three or more failed trials of antipsychotic monotherapy by history:  No  Recommended Plan for Multiple Antipsychotic Therapies: NA     Discharge Instructions    Discharge instructions    Complete by:  As directed   You are hereby instructed to make an appointment to see a Rheumatologist in [facility]. Address: [facility], [address], [place], [place]. [address]. May require referral from your medical doctor.            Medication List    STOP taking these medications        Biotin 1000 MCG tablet      TAKE these  medications      Indication   Calcium-Vitamin D3 600-400 MG-UNIT Caps  Take 1 tablet by mouth daily. 1218m Ca and 1000units of vit D: For bone health   Indication:  Bone health     diclofenac sodium 1 % Gel  Commonly known as:  VOLTAREN  Apply 2 g topically 4 (four) times daily. For arthritis pain  Indication:  Joint Damage causing Pain and Loss of Function     DULoxetine 60 MG capsule  Commonly known as:  CYMBALTA  Take 1 capsule (60 mg total) by mouth at bedtime. For depression   Indication:  Generalized Anxiety Disorder, Major Depressive Disorder     hydrOXYzine 25 MG tablet  Commonly known as:  ATARAX/VISTARIL  Take 1 tablet (25 mg total) by mouth 2 (two) times daily after a meal. For anxiety/insomnia   Indication:  Anxiety/insomnia     mirtazapine 7.5 MG tablet  Commonly known as:  REMERON  Take 1 tablet (7.5 mg total) by mouth at bedtime. For insomnia   Indication:  Trouble Sleeping, Major Depressive Disorder     multivitamin with minerals Tabs tablet  Take 1 tablet by mouth at bedtime. For low Vitamin   Indication:  Low Vitamin     vitamin B-12 1000 MCG tablet  Commonly known as:  CYANOCOBALAMIN  Take 1 tablet (1,000 mcg total) by mouth daily. For B-12 deficiency   Indication:  Vitamin B12 Absorption Study     Vitamin D (Cholecalciferol) 1000 units Caps  Take 7,000 Units by mouth daily. For bone health   Indication:  Bone health       Follow-up Information    Follow up with [facility].   Why:  Please call at discharge to schedule a medication management appointment with Dr. Casimiro Needle. $150 deposit required to schedule initial appointment.    Contact information:   [address] [place], [place], [place] [address] [phone]      Follow up with [facility] On 05/13/2015.   Why:  Therapy appointment with Dr. Vita Barley on Monday may 8th at [place]. Call office if you need to reschedule appointment.   Contact information:   [address] [place], [place] [address] Phone: [phone] Fax: [phone]      Follow up with [facility] On 05/10/2015.   Why:  Appointment with Dr. [name] on Friday May 5th at 11 am. Call office if you need to reschedule.   Contact information:   [address]     [place] [address] [phone]      Follow up with Dr. Delena Bali On 05/10/2015.   Why:  Hospital follow up appointment for medication management on Friday May 5th at 2pm. Call office if you need to reschedule.    Contact information:   [address] [place], [place] [address] Phone: [phone]      Follow up with [facility].   Why:  Please call or consult with your Neurologist if interested in neuropsychological evaluation.    Contact information:   [address] [place]  [place], [place] [address] [phone]     Follow-up recommendations: Activity:  As tolerated Diet: As recommended by your primary care doctor. Keep all scheduled follow-up appointments as recommended.   Comments: Take all your medications as prescribed by your mental healthcare provider. Report any adverse effects and or reactions from your medicines to your outpatient provider promptly. Patient is instructed and cautioned to not engage in alcohol and or illegal drug use while on prescription medicines. In the event of worsening symptoms, patient is instructed to call the crisis hotline, 911 and or go to the nearest ED for appropriate evaluation and treatment of symptoms. Follow-up with your primary care provider for your other medical issues, concerns and or health care needs.  Signed: Elmarie Shiley, NP, NP-C  05/03/2015, 3:11 PM   Patient seen, Suicide Assessment Completed.  Disposition Plan Reviewed

## 2015-05-04 NOTE — Progress Notes (Signed)
Late entry for 4.28.2017 Pt completed her daily assessment and on it she wrote she denied active SI today and she rated her depression, hopelessness and anxeity " 8/9/0", respectively.

## 2015-05-08 DIAGNOSIS — F329 Major depressive disorder, single episode, unspecified: Secondary | ICD-10-CM | POA: Diagnosis not present

## 2015-05-08 DIAGNOSIS — Z6831 Body mass index (BMI) 31.0-31.9, adult: Secondary | ICD-10-CM | POA: Diagnosis not present

## 2015-05-08 DIAGNOSIS — R768 Other specified abnormal immunological findings in serum: Secondary | ICD-10-CM | POA: Diagnosis not present

## 2015-05-08 DIAGNOSIS — M5126 Other intervertebral disc displacement, lumbar region: Secondary | ICD-10-CM | POA: Diagnosis not present

## 2015-05-10 ENCOUNTER — Encounter: Payer: Self-pay | Admitting: Neurology

## 2015-05-10 ENCOUNTER — Ambulatory Visit (INDEPENDENT_AMBULATORY_CARE_PROVIDER_SITE_OTHER): Payer: Commercial Managed Care - HMO | Admitting: Neurology

## 2015-05-10 VITALS — BP 124/78 | HR 67 | Ht 70.0 in | Wt 207.6 lb

## 2015-05-10 DIAGNOSIS — M546 Pain in thoracic spine: Secondary | ICD-10-CM

## 2015-05-10 DIAGNOSIS — M4724 Other spondylosis with radiculopathy, thoracic region: Secondary | ICD-10-CM | POA: Diagnosis not present

## 2015-05-10 DIAGNOSIS — R41840 Attention and concentration deficit: Secondary | ICD-10-CM

## 2015-05-10 DIAGNOSIS — R413 Other amnesia: Secondary | ICD-10-CM

## 2015-05-10 MED ORDER — PREGABALIN 50 MG PO CAPS: 50.0000 mg | ORAL_CAPSULE | Freq: Two times a day (BID) | ORAL | Status: DC

## 2015-05-10 MED ORDER — LIDOCAINE 5 % EX OINT: 1.0000 "application " | TOPICAL_OINTMENT | CUTANEOUS | Status: DC | PRN

## 2015-05-10 NOTE — Progress Notes (Signed)
[facility]    Provider:  Dr [name] Referring Provider: [name], MD Primary Care Physician:  [name], MD  CC:  Back pain, cognitive changes, dizziness, depression and anxiety  HPI:  Katherine Frye is a very lovely 70 y.o. female here as a referral from Dr. Delena Bali for back pain in the lumbar and thoracic areas.She has a past medical history of depression, anxiety, insomnia, dizziness, tremor, hypertension,breast cancer in 2010 status post mastectomy, vitamin B12 deficiency, episode of transient loss of consciousness in the setting of stress, recent admission for depression and anxiety to inpatient psychiatry. She is feeling better as far as her mood is concerned. She is here with her very nice daughter who provides a tremendous amount of support and help. Patient was started on Cymbalta while inpatient in the psychiatric unit and says this is helping her. She has been complaining of several months of burning pain in the thoracic area (points to areas between T2 and T10 on her back). She describes it as burning pain from the bra line all the way down to her lumbar spine. Topical Voltaren has helped a lot. However this pain has been very severe and she has been preoccupied with the pain and feels that this has been worsening her mood. The burning pain is more on the right side of her spine and she denies any inciting events such as lesions or trauma. No one has been able to tell her why she is having this pain. She also reports low back pain paraspinal muscle pain and tightness more on the right as well. All this started in December right before the end of last year and has been progressive. She denies any current neck pain. She also describes burning and tightness (points to the right side lower paraspinal muscles). She is already seen [facility] who performed an MRI of the lumbar spine which was fine per report from patient and daughter. She saw Dr.  Sherwood Gambler. She describes the burning in her thoracic area like a curable sunburn which is sensitive to touch. She is also excessively fatigued. Patient denies any previous psychiatric history, her psychiatric issues started in February. She feels as though her memory is very good. Her problems are more difficulty in concentration and lack of focus. Discussed EEG which was unremarkable except for nonspecific slowing of unknown etiology. She denies any other focal neurologic deficits or complaints. She does say she had a recent MRI at a hospital outside of [facility], MRI of the brain was unremarkable.   Interval update 03/15/2015: 70 year old female here for follow up of multiple complaints. She has a past medical history of significant psychiatric stressors, hyperlipidemia, hypertension, irritable bowel syndrome, osteopenia, vitamin D deficiency, nonalcoholic fatty liver, chronic insomnia, breast cancer in 2010 status post mastectomy, vitamin B12 deficiency. She had an episode last Wednesday. She was sitting at the table and eveything went ok but she ws getting really hot, she felt bad, she laid back and passed out, 2 minutes, she was agitated, she couldn't sit stil, she went "up and back", her left arm went limp, right arm went up and her head was shaking her head turned to the right, eyes fixated on the ceiling, she closed her eyes and became unresponsive while she laid back on the table. Doctor was shaking her and she was [place]. When she woke up her voice was different. She was not confused, she kept saying to her kids "don't ket me die". She knew  exactly where she was. She did not bite her tongue, dId not urinate or defecate. This was in the setting of the doctor telling her something unpleasant.   She is having persistant dizziness. On February 17th she was having nausea and dizziness. MRI was negative.Neck and back hurting. She has arthritis in her neck and back. She has not been taking her medications as  prescribed per daughter. She was having diarrhea and nausea that week. The following Saturday the 25th daughter went there and she was still having nausea and dizziness. She was plaved on valium, antivert and daughter started taking care of her meds for patient to help her with pill box, then later that week she started taking effexor which was a medication they had discontinued. There have been lots of medication changes in the past few months. She still experiences tremor and multiple other complaints, significant insomnia, she was recently seen by psychiatry and is following up there.   MRI of the brain 06/2014: Feel this MRI is normal for age. 1. Mild perisylvian atrophy. 2. Few scattered periventricular and subcortical foci of non-specific gliosis. 3. No acute findings.  Reviewed notes from [facility] emergency Department. Patient was seen 03/13/2015. Episode of "passing out". Vital signs within normal limits. CMP and CBC were unremarkable with a creatinine of 0.8 on 03/13/2015. CT head without contrast showed no acute intracranial abnormality, mild cerebral atrophy, mild. Ventricular white matter decreased attenuation probably due to chronic small vessel ischemic changes. Urinalysis was negative for infection. Chest x-ray showed no active disease.  Reviewed notes from [facility] internal medicine. She had been seen there on a regular basis for ongoing problems with dizziness and vertigo. Per notes, MRI of the brain was normal and carotid ultrasound normal. CBC was normal. B12 level normal. They have tried her on meclizine, Phenergan and Transderm scopolamine patch but she still has episodes of vertigo and feels like the room is spinning, with nausea, no vomiting and an of the medications have worked. She also has arthritic pains and meloxicam was restarted. She has anxiety and depression. In the past she was on venlafaxine and they felt that she was depressed over her state and venlafaxine  was restarted. She is not sleeping well with significant insomnia, has a very flat affect and looks depressed.  Initial visit 05/2014: Katherine Frye is a 70 y.o. female here as a referral from Dr. Delena Bali for memory loss and tremor. She has a past medical history of hyperlipidemia, hypertension, irritable bowel syndrome, osteopenia, vitamin D deficiency, nonalcoholic fatty liver, chronic insomnia, breast cancer in 2010 status post mastectomy, vitamin B12 deficiency, kidney stones, malignant tumor of the. Knee him, metabolic syndrome. Patient is forgetting things, forgets what she is saying in the middle of a sentence, she reports slurred speech. She can't focus, she forgets simple words, she gets stumped. She forgets appointments. Daughter is here and provides much information. Patient lives independently with husband, pays own bills, cooks, cleans, drives, completely independent. No accidents in the home, not getting lost when driving. Memory symptoms have been going on for 3 years, worsening in the last 6 months. She had breast cancer, her sister died of ALS in 01-31-2022. She has been on Effexor for 5 years. No depression, denies mood issues. Reducing the venlafaxine helped with the tremor. Tremor in the right hand is worse when she picks up a glass for example. When she eats, it is difficult as well. No tremor in the chin or lower extremities. Worse  tremor with fine motor tasks. No tremor in the family. Mother with dementia which started in her eighties. Denies neuropathy, did not get chemotherapy or radiation with her breast cancer. No shuffling, no stiffness, no symptoms of parkinsonism. She denies symptoms of obstructive sleep apnea, which can cause memory like problems, and had a sleep study within the last year approximately that was unremarkable. No family history of Parkinson's disease.  Reviewed notes, labs and imaging from outside physicians, which showed: Patient has a history of breast cancer which  she underwent right mastectomy in November 2010 with no recurrence and is closely followed by Dr. Bobby Rumpf. Most recent mammogram normal and gets regular mammograms done. She is on Femora. Her pulse is in the 50s which is common for her. Blood pressure is normal. She takes B12 supplements, with a history of B12 deficiency.  Review of Systems: Patient complains of symptoms per HPI as well as the following symptoms: Activity change, chills, fatigue, unexpected weight change, drooling, weakness, tremors, joint pain, back pain, restless legs, daytime sleepiness, sleep talking, decreased concentration, nervous anxiety. Pertinent negatives per HPI. All others negative.   Social History   Social History  . Marital Status: Married    Spouse Name: Katherine Nachtigal "Vic"  . Number of Children: 2  . Years of Education: 12   Occupational History  . Not on file.   Social History Main Topics  . Smoking status: Never Smoker   . Smokeless tobacco: Never Used  . Alcohol Use: No  . Drug Use: No  . Sexual Activity: Not on file   Other Topics Concern  . Not on file   Social History Narrative   Lives at home with husband.   Caffeine use: Drinks 1 cup coffee per day (sometimes 2 cups per day)       Family History  Problem Relation Age of Onset  . Lymphoma Sister     non-hodgkin  . Cancer Sister     cancer of thyroid  . Cancer Other     family history of colon breast and ovarian cancer, no relationships listed    Past Medical History  Diagnosis Date  . Allergy 2010    medication and seasonal  . Special screening for malignant neoplasms, colon 2013  . Obesity, unspecified 2013  . Cancer [facility]) 2010    right breast  . Malignant neoplasm of upper-inner quadrant of female breast ([place]) 2010  . Solitary cyst of breast 2012    left    Past Surgical History  Procedure Laterality Date  . Colonoscopy  2010    Dr. Lyndel Safe, [place]  . Upper gi endoscopy  2010  . Foot surgery  1996  .  Tonsillectomy    . Appendectomy    . Nasal sinus surgery  1991  . Skin cancer excision  2013    basel cell on forehead  . Knee surgery Right 2013    replacement  . Knee arthroscopy Right 2012    May and October  . Tubal ligation    . Breast surgery Right 2010    T2, N0, ER/PR positive, HER-2/neu negative, Oncotype recurrence score  of 10 ( 7% risk of recurrence)   . Breast cyst excision Right 2006    fibroid cyst  . Breast biopsy Right 2010    Current Outpatient Prescriptions  Medication Sig Dispense Refill  . Calcium Carb-Cholecalciferol (CALCIUM-VITAMIN D3) 600-400 MG-UNIT CAPS Take 1 tablet by mouth daily. '1200mg'$  Ca and 1000units of vit D: For bone  health 1 capsule 0  . diclofenac sodium (VOLTAREN) 1 % GEL Apply 2 g topically 4 (four) times daily. For arthritis pain    . DULoxetine (CYMBALTA) 60 MG capsule Take 1 capsule (60 mg total) by mouth at bedtime. For depression 30 capsule 0  . hydrOXYzine (ATARAX/VISTARIL) 25 MG tablet Take 1 tablet (25 mg total) by mouth 2 (two) times daily after a meal. For anxiety/insomnia 60 tablet 0  . mirtazapine (REMERON) 7.5 MG tablet Take 1 tablet (7.5 mg total) by mouth at bedtime. For insomnia 30 tablet 0  . Multiple Vitamin (MULTIVITAMIN WITH MINERALS) TABS tablet Take 1 tablet by mouth at bedtime. For low Vitamin    . vitamin B-12 (CYANOCOBALAMIN) 1000 MCG tablet Take 1 tablet (1,000 mcg total) by mouth daily. For B-12 deficiency    . Vitamin D, Cholecalciferol, 1000 units CAPS Take 7,000 Units by mouth daily. For bone health 1 capsule 0  . [DISCONTINUED] venlafaxine (EFFEXOR) 37.5 MG tablet Take 37.5 mg by mouth at bedtime. Reported on 02/18/2015     No current facility-administered medications for this visit.    Allergies as of 05/10/2015 - Review Complete 05/10/2015  Allergen Reaction Noted  . Levaquin [levofloxacin in d5w] Nausea And Vomiting 06/06/2012    Vitals: BP 124/78 mmHg  Pulse 67  Ht _0  (1.778 m)  Wt 207 lb 9.6 oz  (94.167 kg)  BMI 29.79 kg/m2 Last Weight:  Wt Readings from Last 1 Encounters:  05/10/15 207 lb 9.6 oz (94.167 kg)   Last Height:   Ht Readings from Last 1 Encounters:  05/10/15 _1  (1.778 m)   Exam: Gen: NAD, flat affect but significantly improved from last visit  Eyes: Conjunctivae clear without exudates or hemorrhage MSK: Right lower lumbar paraspinal muscle tenderness on palpation, no tenderness of the spinal processes of the thoracic or lumbar Neuro: Detailed Neurologic Exam  Speech:  Speech is normal; fluent and spontaneous with normal comprehension.  Cognition: MoCA 25/30 (-2 visual-spatial, -1 naming, -1 delayed recall, minus one serial 7) in May 2016  The patient is oriented to person, place, and time today;  Cranial Nerves:  The pupils are equal, round, and reactive to light. Visual fields are full. Extraocular movements are intact. Trigeminal sensation is intact and the muscles of mastication are normal. The face is symmetric. Hearing intact. Voice is normal. Shoulder shrug is normal.  Gait:  No ataxia, no shuffling, good arm swing  Motor Observation:  High frequency, low amplitude postural tremor likely physiologic or essential tremor. Tone:  Normal muscle tone.   Posture:  Posture is normal. normal erect   Strength:  Strength is V/V in the upper and lower limbs.      Assessment/Plan: 70 year old female here for follow up. Today she complains of a new symptom of back pain in the lumbar and thoracic areas. She describes the thoracic areas as feeling like they're severely burned such as in a sunburn, no inciting events or lesions. She also reports low back pain in the paraspinal muscles more on the right as well. Recent MRIs of the brain which she had within the last few months at an outside hospital were unremarkable per daughter and patient. MRI of the lumbar spine done with Dr. Sherwood Gambler was also unremarkable per  daughter and patient. She has been struggling recently with anxiety and depression that started in February, denies any previous history of psychiatric illness. Recent EEG after a transient loss of consciousness was unremarkable except for nonspecific slowing of  unknown etiology. She has described memory problems in the past, lack of concentration, lack of focus.  Her subacute memory, focus and concentration issues with onset of depression and anxiety in February in the setting of ANA positive, I do agree with rheumatology referral. They have been referred to Dr. Patrecia Pour. I wonder if we should consider autoimmune encephalopathy. Will send this note to Dr. Patrecia Pour and await evaluation results.  Tremor improved with decrease of venlafaxine in the past, but she is now on Cymbalta due to depression and anxiety. Tremor still affects her eating and writing and fine motor tasks. No signs of parkinsonism on exam. Sister has a tremor. Exam is otherwise nonfocal. Likely medication related versus essential vs physiologic tremor. Stp all caffeine. TSH has been normal (last 4/21 1.706). Tried Neurontin in the past but she had significant side effects, will follow clinically.  Back pain: MRI of the thoracic region. Physical therapy for lumbosacral musle strain. Lidocaine topical. Lyrica 23m bid (Discussed side effects to Lyrica including serious reaction such as hypersensitivity, angioedema, Stevens-Johnson syndrome and rash, rhabdomyolysis, suicidality as well as the common reactions which include dizziness, somnolence, dry mouth, peripheral edema, weight gain, abnormal thinking, constipation, pain, impaired coordination and decreased platelets.)  Recommend  neurocognitive testing at [facility]. Also recommend discontinuing the hydroxyzine.  For the transient episode of alteration of awareness: Does not sound like a seizure, may be a nonepileptic event in the setting of stress. EEG did not show  epileptiform activity. If she has this again will order a 3 day ambulatory EEG.  Her fatigue severity scale was 35 and her Epworth Sleepiness Scale is 2. Not really concerning for obstructive sleep apnea and previous unremarkable sleep study.  For her dizziness and vertigo: Agree with ENT evaluation per PCP. Recent MRI of the brain was by report unremarkable. CTA of the blood vessels of the head did not show vertebrobasilar insufficiency that could be causing her symptoms   CC: Dr. SDelena Baliand Dr. DPatrecia Pour Addendum: B12 658 and TSH within normal limits collected February 2016.  [facility] She has been seen there on a regular basis for ongoing problems with dizziness and vertigo. Per notes, MRI of the brain was normal and carotid ultrasound normal. CBC was normal. B12 level normal. They have tried her on meclizine, Phenergan and Transderm scopolamine patch but she still has episodes of vertigo and feels like the room is spinning, with nausea, no vomiting and None of the medications have worked. She also has arthritic pains and meloxicam was restarted. She has anxiety and depression. In the past she was on venlafaxine and they felt that she was depressed over her state and venlafaxine was restarted. She is not sleeping well with significant insomnia, has a very flat affect and looks depressed.  ASarina Ill MD  [facility] [address]SHarperGDulles Town Center [place] [address] Phone [phone] [phone] A total of 60 minutes was spent face-to-face with this patient. Over half this time was spent on counseling patient on the cognitive deficits, lumbar and thoracic back pain, tremor diagnosis and different diagnostic and therapeutic options available.

## 2015-05-10 NOTE — Patient Instructions (Signed)
Remember to drink plenty of fluid, eat healthy meals and do not skip any meals. Try to eat protein with a every meal and eat a healthy snack such as fruit or nuts in between meals. Try to keep a regular sleep-wake schedule and try to exercise daily, particularly in the form of walking, 20-30 minutes a day, if you can.   As far as your medications are concerned, I would like to suggest: topical Lidocaine, Lyrica 50mg  twice daily  As far as diagnostic testing: MRI thoracic spine, Neurocognitive testing, physical therapy  I would like to see you back in 3 months, sooner if we need to. Please call us with any interim questions, concerns, problems, updates or refill requests.   Our phone number is [phone]. We also have an after hours call service for urgent matters and there is a physician on-call for urgent questions. For any emergencies you know to call 911 or go to the nearest emergency room

## 2015-05-11 ENCOUNTER — Encounter: Payer: Self-pay | Admitting: Neurology

## 2015-05-13 DIAGNOSIS — F320 Major depressive disorder, single episode, mild: Secondary | ICD-10-CM | POA: Diagnosis not present

## 2015-05-16 DIAGNOSIS — M546 Pain in thoracic spine: Secondary | ICD-10-CM | POA: Diagnosis not present

## 2015-05-20 DIAGNOSIS — F320 Major depressive disorder, single episode, mild: Secondary | ICD-10-CM | POA: Diagnosis not present

## 2015-05-21 DIAGNOSIS — M546 Pain in thoracic spine: Secondary | ICD-10-CM | POA: Diagnosis not present

## 2015-05-23 DIAGNOSIS — M546 Pain in thoracic spine: Secondary | ICD-10-CM | POA: Diagnosis not present

## 2015-05-27 ENCOUNTER — Encounter: Payer: Self-pay | Admitting: *Deleted

## 2015-05-27 DIAGNOSIS — M546 Pain in thoracic spine: Secondary | ICD-10-CM | POA: Diagnosis not present

## 2015-05-27 NOTE — Progress Notes (Signed)
Faxed signed POC for PT by Dr [name] back to [facility] PT. Fax: [phone]. Received fax confirmation.

## 2015-05-29 DIAGNOSIS — F418 Other specified anxiety disorders: Secondary | ICD-10-CM | POA: Diagnosis not present

## 2015-05-29 DIAGNOSIS — Z6832 Body mass index (BMI) 32.0-32.9, adult: Secondary | ICD-10-CM | POA: Diagnosis not present

## 2015-05-29 DIAGNOSIS — M546 Pain in thoracic spine: Secondary | ICD-10-CM | POA: Diagnosis not present

## 2015-05-29 DIAGNOSIS — M5126 Other intervertebral disc displacement, lumbar region: Secondary | ICD-10-CM | POA: Diagnosis not present

## 2015-05-29 DIAGNOSIS — M4724 Other spondylosis with radiculopathy, thoracic region: Secondary | ICD-10-CM | POA: Diagnosis not present

## 2015-05-29 DIAGNOSIS — M4696 Unspecified inflammatory spondylopathy, lumbar region: Secondary | ICD-10-CM | POA: Diagnosis not present

## 2015-05-31 DIAGNOSIS — M546 Pain in thoracic spine: Secondary | ICD-10-CM | POA: Diagnosis not present

## 2015-06-04 DIAGNOSIS — M546 Pain in thoracic spine: Secondary | ICD-10-CM | POA: Diagnosis not present

## 2015-06-05 DIAGNOSIS — F320 Major depressive disorder, single episode, mild: Secondary | ICD-10-CM | POA: Diagnosis not present

## 2015-06-06 DIAGNOSIS — M546 Pain in thoracic spine: Secondary | ICD-10-CM | POA: Diagnosis not present

## 2015-06-11 DIAGNOSIS — M546 Pain in thoracic spine: Secondary | ICD-10-CM | POA: Diagnosis not present

## 2015-06-13 DIAGNOSIS — F320 Major depressive disorder, single episode, mild: Secondary | ICD-10-CM | POA: Diagnosis not present

## 2015-06-13 DIAGNOSIS — M546 Pain in thoracic spine: Secondary | ICD-10-CM | POA: Diagnosis not present

## 2015-06-24 ENCOUNTER — Ambulatory Visit: Payer: Commercial Managed Care - HMO | Admitting: Neurology

## 2015-06-24 DIAGNOSIS — M546 Pain in thoracic spine: Secondary | ICD-10-CM | POA: Diagnosis not present

## 2015-06-26 DIAGNOSIS — F320 Major depressive disorder, single episode, mild: Secondary | ICD-10-CM | POA: Diagnosis not present

## 2015-06-27 DIAGNOSIS — F329 Major depressive disorder, single episode, unspecified: Secondary | ICD-10-CM | POA: Diagnosis not present

## 2015-07-01 DIAGNOSIS — M546 Pain in thoracic spine: Secondary | ICD-10-CM | POA: Diagnosis not present

## 2015-07-03 DIAGNOSIS — M7072 Other bursitis of hip, left hip: Secondary | ICD-10-CM | POA: Diagnosis not present

## 2015-07-03 DIAGNOSIS — Z1389 Encounter for screening for other disorder: Secondary | ICD-10-CM | POA: Diagnosis not present

## 2015-07-03 DIAGNOSIS — Z6833 Body mass index (BMI) 33.0-33.9, adult: Secondary | ICD-10-CM | POA: Diagnosis not present

## 2015-07-08 DIAGNOSIS — M546 Pain in thoracic spine: Secondary | ICD-10-CM | POA: Diagnosis not present

## 2015-07-10 DIAGNOSIS — M545 Low back pain: Secondary | ICD-10-CM | POA: Diagnosis not present

## 2015-07-15 DIAGNOSIS — M546 Pain in thoracic spine: Secondary | ICD-10-CM | POA: Diagnosis not present

## 2015-07-22 DIAGNOSIS — M546 Pain in thoracic spine: Secondary | ICD-10-CM | POA: Diagnosis not present

## 2015-07-29 DIAGNOSIS — M546 Pain in thoracic spine: Secondary | ICD-10-CM | POA: Diagnosis not present

## 2015-08-01 DIAGNOSIS — E669 Obesity, unspecified: Secondary | ICD-10-CM | POA: Diagnosis not present

## 2015-08-01 DIAGNOSIS — M25561 Pain in right knee: Secondary | ICD-10-CM | POA: Diagnosis not present

## 2015-08-01 DIAGNOSIS — G250 Essential tremor: Secondary | ICD-10-CM | POA: Diagnosis not present

## 2015-08-01 DIAGNOSIS — Z6834 Body mass index (BMI) 34.0-34.9, adult: Secondary | ICD-10-CM | POA: Diagnosis not present

## 2015-08-01 DIAGNOSIS — Z96651 Presence of right artificial knee joint: Secondary | ICD-10-CM | POA: Diagnosis not present

## 2015-08-15 DIAGNOSIS — F320 Major depressive disorder, single episode, mild: Secondary | ICD-10-CM | POA: Diagnosis not present

## 2015-08-30 DIAGNOSIS — K580 Irritable bowel syndrome with diarrhea: Secondary | ICD-10-CM | POA: Diagnosis not present

## 2015-09-04 DIAGNOSIS — F320 Major depressive disorder, single episode, mild: Secondary | ICD-10-CM | POA: Diagnosis not present

## 2015-09-10 ENCOUNTER — Ambulatory Visit (INDEPENDENT_AMBULATORY_CARE_PROVIDER_SITE_OTHER): Payer: Commercial Managed Care - HMO | Admitting: Neurology

## 2015-09-10 ENCOUNTER — Encounter: Payer: Self-pay | Admitting: Neurology

## 2015-09-10 VITALS — BP 152/90 | HR 62 | Ht 70.0 in | Wt 228.4 lb

## 2015-09-10 DIAGNOSIS — R0683 Snoring: Secondary | ICD-10-CM

## 2015-09-10 DIAGNOSIS — G4719 Other hypersomnia: Secondary | ICD-10-CM

## 2015-09-10 NOTE — Patient Instructions (Addendum)
Remember to drink plenty of fluid, eat healthy meals and do not skip any meals. Try to eat protein with a every meal and eat a healthy snack such as fruit or nuts in between meals. Try to keep a regular sleep-wake schedule and try to exercise daily, particularly in the form of walking, 20-30 minutes a day, if you can.   As far as your medications are concerned, I would like to suggest continue topical voltaren  My clinical assistant and will answer any of your questions and relay your messages to me and also relay most of my messages to you.   Our phone number is [phone]. We also have an after hours call service for urgent matters and there is a physician on-call for urgent questions. For any emergencies you know to call 911 or go to the nearest emergency room

## 2015-09-10 NOTE — Progress Notes (Signed)
[facility]    Provider:  Dr [name] Referring Provider: [name], MD Primary Care Physician:  [name], MD   CC:  Back pain, cognitive changes, dizziness, depression and anxiety  She is still having a lot of burning pain in the thoracic back. She has used the voltaren cream twice and it helps. She is on Mobic. He has burning paraspinal pain in the lower thoracic and lumbar areas. Physical therapy helped. She never had the MRI of the thoracic or lumbar spine. She was evaluated by [facility] who imaged her and said she had some bulging disks but nothing surgical. I prescribed Lyrica in the past and she did not take it unclear why. She fell out of the bed with gabapentin. She does not nap a lot during the day. She is sleep during the day. Voltaren helps. No memory changes. She ahs no tremor on exam today. When she drinks coffee she only drinks decaf. She has to hold it steady, left is worse than left, with action. She has an essential tremor. No FHx of tremors. Here with her niece today. Discussed treatment, she wants to hold off on it for now. She is excessively tired during the day and snoring. She sleeps 9 hours a night, does not wake up refreshed and is tired during the day. Had a sleep eval 5 years ago.Does not wake up refreeshed.   HPI:  Katherine Frye is a very lovely 70 y.o. female here as a referral from Dr. Delena Bali for back pain in the lumbar and thoracic areas.She has a past medical history of depression, anxiety, insomnia, dizziness, tremor, hypertension,breast cancer in 2010 status post mastectomy, vitamin B12 deficiency, episode of transient loss of consciousness in the setting of stress, recent admission for depression and anxiety to inpatient psychiatry. She is feeling better as far as her mood is concerned. She is here with her very nice daughter who provides a tremendous amount of support and help. Patient was started on Cymbalta while inpatient in the  psychiatric unit and says this is helping her. She has been complaining of several months of burning pain in the thoracic area (points to areas between T2 and T10 on her back). She describes it as burning pain from the bra line all the way down to her lumbar spine. Topical Voltaren has helped a lot. However this pain has been very severe and she has been preoccupied with the pain and feels that this has been worsening her mood. The burning pain is more on the right side of her spine and she denies any inciting events such as lesions or trauma. No one has been able to tell her why she is having this pain. She also reports low back pain paraspinal muscle pain and tightness more on the right as well. All this started in December right before the end of last year and has been progressive. She denies any current neck pain. She also describes burning and tightness (points to the right side lower paraspinal muscles). She is already seen [place] [facility] who performed an MRI of the lumbar spine which was fine per report from patient and daughter. She saw Dr. Sherwood Gambler. She describes the burning in her thoracic area like a curable sunburn which is sensitive to touch. She is also excessively fatigued. Patient denies any previous psychiatric history, her psychiatric issues started in February. She feels as though her memory is very good. Her problems are more difficulty in concentration and lack of focus. Discussed  EEG which was unremarkable except for nonspecific slowing of unknown etiology. She denies any other focal neurologic deficits or complaints. She does say she had a recent MRI at a hospital outside of [facility], MRI of the brain was unremarkable.   Interval update 03/15/2015: 70 year old female here for follow up of multiple complaints. She has a past medical history of significant psychiatric stressors, hyperlipidemia, hypertension, irritable bowel syndrome, osteopenia, vitamin D deficiency, nonalcoholic fatty  liver, chronic insomnia, breast cancer in 2010 status post mastectomy, vitamin B12 deficiency. She had an episode last Wednesday. She was sitting at the table and eveything went ok but she ws getting really hot, she felt bad, she laid back and passed out, 2 minutes, she was agitated, she couldn't sit stil, she went "up and back", her left arm went limp, right arm went up and her head was shaking her head turned to the right, eyes fixated on the ceiling, she closed her eyes and became unresponsive while she laid back on the table. Doctor was shaking her and she was [place]. When she woke up her voice was different. She was not confused, she kept saying to her kids "don't ket me die". She knew exactly where she was. She did not bite her tongue, dId not urinate or defecate. This was in the setting of the doctor telling her something unpleasant.   She is having persistant dizziness. On February 17th she was having nausea and dizziness. MRI was negative.Neck and back hurting. She has arthritis in her neck and back. She has not been taking her medications as prescribed per daughter. She was having diarrhea and nausea that week. The following Saturday the 25th daughter went there and she was still having nausea and dizziness. She was plaved on valium, antivert and daughter started taking care of her meds for patient to help her with pill box, then later that week she started taking effexor which was a medication they had discontinued. There have been lots of medication changes in the past few months. She still experiences tremor and multiple other complaints, significant insomnia, she was recently seen by psychiatry and is following up there.   MRI of the brain 06/2014: Feel this MRI is normal for age. 1. Mild perisylvian atrophy. 2. Few scattered periventricular and subcortical foci of non-specific gliosis. 3. No acute findings.  Reviewed notes from [facility] emergency Department. Patient was seen  03/13/2015. Episode of "passing out". Vital signs within normal limits. CMP and CBC were unremarkable with a creatinine of 0.8 on 03/13/2015. CT head without contrast showed no acute intracranial abnormality, mild cerebral atrophy, mild. Ventricular white matter decreased attenuation probably due to chronic small vessel ischemic changes. Urinalysis was negative for infection. Chest x-ray showed no active disease.  Reviewed notes from [facility] internal medicine. She had been seen there on a regular basis for ongoing problems with dizziness and vertigo. Per notes, MRI of the brain was normal and carotid ultrasound normal. CBC was normal. B12 level normal. They have tried her on meclizine, Phenergan and Transderm scopolamine patch but she still has episodes of vertigo and feels like the room is spinning, with nausea, no vomiting and an of the medications have worked. She also has arthritic pains and meloxicam was restarted. She has anxiety and depression. In the past she was on venlafaxine and they felt that she was depressed over her state and venlafaxine was restarted. She is not sleeping well with significant insomnia, has a very flat affect and looks  depressed.  Initial visit 05/2014: Katherine Frye is a 70 y.o. female here as a referral from Dr. Delena Bali for memory loss and tremor. She has a past medical history of hyperlipidemia, hypertension, irritable bowel syndrome, osteopenia, vitamin D deficiency, nonalcoholic fatty liver, chronic insomnia, breast cancer in 2010 status post mastectomy, vitamin B12 deficiency, kidney stones, malignant tumor of the. Knee him, metabolic syndrome. Patient is forgetting things, forgets what she is saying in the middle of a sentence, she reports slurred speech. She can't focus, she forgets simple words, she gets stumped. She forgets appointments. Daughter is here and provides much information. Patient lives independently with husband, pays own bills, cooks, cleans,  drives, completely independent. No accidents in the home, not getting lost when driving. Memory symptoms have been going on for 3 years, worsening in the last 6 months. She had breast cancer, her sister died of ALS in 01-19-2022. She has been on Effexor for 5 years. No depression, denies mood issues. Reducing the venlafaxine helped with the tremor. Tremor in the right hand is worse when she picks up a glass for example. When she eats, it is difficult as well. No tremor in the chin or lower extremities. Worse tremor with fine motor tasks. No tremor in the family. Mother with dementia which started in her eighties. Denies neuropathy, did not get chemotherapy or radiation with her breast cancer. No shuffling, no stiffness, no symptoms of parkinsonism. She denies symptoms of obstructive sleep apnea, which can cause memory like problems, and had a sleep study within the last year approximately that was unremarkable. No family history of Parkinson's disease.  Reviewed notes, labs and imaging from outside physicians, which showed: Patient has a history of breast cancer which she underwent right mastectomy in November 2010 with no recurrence and is closely followed by Dr. Bobby Rumpf. Most recent mammogram normal and gets regular mammograms done. She is on Femora. Her pulse is in the 50s which is common for her. Blood pressure is normal. She takes B12 supplements, with a history of B12 deficiency.  Review of Systems: Patient complains of symptoms per HPI as well as the following symptoms: Activity change, chills, fatigue, unexpected weight change, drooling, weakness, tremors, joint pain, back pain, restless legs, daytime sleepiness, sleep talking, decreased concentration, nervous anxiety. Pertinent negatives per HPI. All others negative.  Social History   Social History  . Marital status: Married    Spouse name: Amna Welker "Vic"  . Number of children: 2  . Years of education: 12   Occupational History  . Not on  file.   Social History Main Topics  . Smoking status: Never Smoker  . Smokeless tobacco: Never Used  . Alcohol use No  . Drug use: No  . Sexual activity: Not on file   Other Topics Concern  . Not on file   Social History Narrative   Lives at home with husband.   Caffeine use: Drinks 1 cup coffee per day (sometimes 2 cups per day)       Family History  Problem Relation Age of Onset  . Cancer Other     family history of colon breast and ovarian cancer, no relationships listed  . Lymphoma Sister     non-hodgkin  . Cancer Sister     cancer of thyroid    Past Medical History:  Diagnosis Date  . Allergy 2010   medication and seasonal  . Cancer ([place]) 2010   right breast  . Malignant neoplasm of upper-inner quadrant of  female breast ([place]) 2010  . Obesity, unspecified 2013  . Solitary cyst of breast 2012   left  . Special screening for malignant neoplasms, colon 2013    Past Surgical History:  Procedure Laterality Date  . APPENDECTOMY    . BREAST BIOPSY Right 2010  . BREAST CYST EXCISION Right 2006   fibroid cyst  . BREAST SURGERY Right 2010   T2, N0, ER/PR positive, HER-2/neu negative, Oncotype recurrence score  of 10 ( 7% risk of recurrence)   . COLONOSCOPY  2010   Dr. Lyndel Safe, [place]  . FOOT SURGERY  1996  . KNEE ARTHROSCOPY Right 2012   May and October  . KNEE SURGERY Right 2013   replacement  . NASAL SINUS SURGERY  1991  . SKIN CANCER EXCISION  2013   basel cell on forehead  . TONSILLECTOMY    . TUBAL LIGATION    . UPPER GI ENDOSCOPY  2010    Current Outpatient Prescriptions  Medication Sig Dispense Refill  . Calcium Carb-Cholecalciferol (CALCIUM-VITAMIN D3) 600-400 MG-UNIT CAPS Take 1 tablet by mouth daily. 1223m Ca and 1000units of vit D: For bone health 1 capsule 0  . diclofenac sodium (VOLTAREN) 1 % GEL Apply 2 g topically 4 (four) times daily. For arthritis pain    . DULoxetine (CYMBALTA) 60 MG capsule Take 1 capsule (60 mg total) by mouth at  bedtime. For depression 30 capsule 0  . meloxicam (MOBIC) 15 MG tablet Take 15 mg by mouth at bedtime.    . mirtazapine (REMERON) 7.5 MG tablet Take 1 tablet (7.5 mg total) by mouth at bedtime. For insomnia 30 tablet 0  . Multiple Vitamin (MULTIVITAMIN WITH MINERALS) TABS tablet Take 1 tablet by mouth at bedtime. For low Vitamin    . vitamin B-12 (CYANOCOBALAMIN) 1000 MCG tablet Take 1 tablet (1,000 mcg total) by mouth daily. For B-12 deficiency    . Vitamin D, Cholecalciferol, 1000 units CAPS Take 7,000 Units by mouth daily. For bone health 1 capsule 0   No current facility-administered medications for this visit.     Allergies as of 09/10/2015 - Review Complete 09/10/2015  Allergen Reaction Noted  . Levaquin [levofloxacin in d5w] Nausea And Vomiting 06/06/2012    Vitals: BP (!) 152/90 (BP Location: Left Arm, Patient Position: Sitting, Cuff Size: Normal)   Pulse 62   Ht _0  (1.778 m)   Wt 228 lb 6.4 oz (103.6 kg)   BMI 32.77 kg/m  Last Weight:  Wt Readings from Last 1 Encounters:  09/10/15 228 lb 6.4 oz (103.6 kg)   Last Height:   Ht Readings from Last 1 Encounters:  09/10/15 _1  (1.778 m)   Montreal Cognitive Assessment  06/05/2014  Visuospatial/ Executive (0/5) 3  Naming (0/3) 2  Attention: Read list of digits (0/2) 2  Attention: Read list of letters (0/1) 1  Attention: Serial 7 subtraction starting at 100 (0/3) 2  Language: Repeat phrase (0/2) 2  Language : Fluency (0/1) 1  Abstraction (0/2) 2  Delayed Recall (0/5) 4  Orientation (0/6) 6  Total 25  Adjusted Score (based on education) 26   Exam: Gen: NAD, flat affect but significantly improved from last visit  Eyes: Conjunctivae clear without exudates or hemorrhage MSK: Right lower lumbar paraspinal muscle tenderness on palpation, no tenderness of the spinal processes of the thoracic or lumbar Neuro: Detailed Neurologic Exam  Speech:  Speech is normal; fluent and spontaneous with normal  comprehension.  Cognition: MoCA 25/30 (-2 visual-spatial, -1 naming, -  1 delayed recall, minus one serial 7) in May 2016  The patient is oriented to person, place, and time today;  Cranial Nerves:  The pupils are equal, round, and reactive to light. Visual fields are full. Extraocular movements are intact. Trigeminal sensation is intact and the muscles of mastication are normal. The face is symmetric. Hearing intact. Voice is normal. Shoulder shrug is normal.  Gait:  No ataxia, no shuffling, good arm swing  Motor Observation:  High frequency, low amplitude postural tremor likely physiologic or essential tremor. Tone:  Normal muscle tone.   Posture:  Posture is normal. normal erect   Strength:  Strength is V/V in the upper and lower limbs.      Assessment/Plan: 70 year old female here for follow up. Today she complains of a new symptom of back pain in the lumbar and thoracic areas. She describes the thoracic areas as feeling like they're severely burned such as in a sunburn, no inciting events or lesions. She also reports low back pain in the paraspinal muscles more on the right as well. Recent MRIs of the brain which she had within the last few months at an outside hospital were unremarkable per daughter and patient. MRI of the lumbar spine done with Dr. Sherwood Gambler was also unremarkable per daughter and patient. She has been struggling recently with anxiety and depression that started in February, denies any previous history of psychiatric illness. Recent EEG after a transient loss of consciousness was unremarkable except for nonspecific slowing of unknown etiology. She has described memory problems in the past, lack of concentration, lack of focus.  Her subacute memory, focus and concentration issues with onset of depression and anxiety, was evaluated in Rheumatology for ANA positive. Continue to follow.   Tremor improved with decrease of venlafaxine in the past,  but she is now on Cymbalta due to depression and anxiety. Tremor still affects her eating and writing and fine motor tasks. No signs of parkinsonism on exam. Sister has a tremor. Exam is otherwise nonfocal. Likely medication related versus essential vs physiologic tremor. Stp all caffeine. TSH has been normal (last 4/21 1.706). Tried Neurontin in the past but she had significant side effects, will follow clinically. Can try propranolol on the future.   Back pain: MRI of the thoracic region(declines, was seen in [facility]). Physical therapy for lumbosacral musle strain helped. Lidocaine topical. Lyrica 31m bid (Discussed side effects to Lyrica including serious reaction such as hypersensitivity, angioedema, Stevens-Johnson syndrome and rash, rhabdomyolysis, suicidality as well as the common reactions which include dizziness, somnolence, dry mouth, peripheral edema, weight gain, abnormal thinking, constipation, pain, impaired coordination and decreased platelets.). Unclear why she did not try Lyrica. Continue Voltaren gel.   Recommend  neurocognitive testing at [facility]. Also recommend discontinuing the hydroxyzine.  For the transient episode of alteration of awareness: Does not sound like a seizure, may be a nonepileptic event in the setting of stress. EEG did not show epileptiform activity. If she has this again will order a 3 day ambulatory EEG.  She is excessively tired during the day and snoring. She sleeps 9 hours a night, does not wake up refreshed and is tired during the day. Had a sleep eval 5 years ago.Does not wake up refereshed. Epworth Sleepiness Scale is 8 but she interpreted questions differently, may be higher than this. Previous unremarkable sleep study 5 years ago. Patient would very much like an evaluation for sleep eval again, will refer her to our sleep team.   For her  dizziness and vertigo: Agree with ENT evaluation per PCP. Recent MRI of the brain was by report  unremarkable. CTA of the blood vessels of the head did not show vertebrobasilar insufficiency that could be causing her symptoms   CC: Dr. Delena Bali and Dr. Patrecia Pour  Addendum: B12 658 and TSH within normal limits collected February 2016.  Reviewed notes from [facility] internal medicine 2017 notes. She has been seen there on a regular basis for ongoing problems with dizziness and vertigo. Per notes, MRI of the brain was normal and carotid ultrasound normal. CBC was normal. B12 level normal. They have tried her on meclizine, Phenergan and Transderm scopolamine patch but she still has episodes of vertigo and feels like the room is spinning, with nausea, no vomiting and None of the medications have worked. She also has arthritic pains and meloxicam was restarted. She has anxiety and depression. In the past she was on venlafaxine and they felt that she was depressed over her state and venlafaxine was restarted. She is not sleeping well with significant insomnia, has a very flat affect and looks depressed.

## 2015-09-17 DIAGNOSIS — K529 Noninfective gastroenteritis and colitis, unspecified: Secondary | ICD-10-CM | POA: Diagnosis not present

## 2015-09-17 DIAGNOSIS — Z6834 Body mass index (BMI) 34.0-34.9, adult: Secondary | ICD-10-CM | POA: Diagnosis not present

## 2015-09-18 DIAGNOSIS — K529 Noninfective gastroenteritis and colitis, unspecified: Secondary | ICD-10-CM | POA: Diagnosis not present

## 2015-09-19 DIAGNOSIS — K529 Noninfective gastroenteritis and colitis, unspecified: Secondary | ICD-10-CM | POA: Diagnosis not present

## 2015-09-25 ENCOUNTER — Institutional Professional Consult (permissible substitution): Payer: Commercial Managed Care - HMO | Admitting: Neurology

## 2015-09-25 DIAGNOSIS — F329 Major depressive disorder, single episode, unspecified: Secondary | ICD-10-CM | POA: Diagnosis not present

## 2015-10-16 DIAGNOSIS — F320 Major depressive disorder, single episode, mild: Secondary | ICD-10-CM | POA: Diagnosis not present

## 2015-10-23 DIAGNOSIS — F320 Major depressive disorder, single episode, mild: Secondary | ICD-10-CM | POA: Diagnosis not present

## 2015-11-01 DIAGNOSIS — Z23 Encounter for immunization: Secondary | ICD-10-CM | POA: Diagnosis not present

## 2015-11-05 DIAGNOSIS — Z800 Family history of malignant neoplasm of digestive organs: Secondary | ICD-10-CM | POA: Diagnosis not present

## 2015-11-05 DIAGNOSIS — Z86010 Personal history of colonic polyps: Secondary | ICD-10-CM | POA: Diagnosis not present

## 2015-11-05 DIAGNOSIS — R197 Diarrhea, unspecified: Secondary | ICD-10-CM | POA: Diagnosis not present

## 2015-11-22 DIAGNOSIS — C50411 Malignant neoplasm of upper-outer quadrant of right female breast: Secondary | ICD-10-CM | POA: Diagnosis not present

## 2015-11-22 DIAGNOSIS — Z170 Estrogen receptor positive status [ER+]: Secondary | ICD-10-CM | POA: Diagnosis not present

## 2015-11-22 DIAGNOSIS — Z79810 Long term (current) use of selective estrogen receptor modulators (SERMs): Secondary | ICD-10-CM | POA: Diagnosis not present

## 2015-11-22 DIAGNOSIS — Z853 Personal history of malignant neoplasm of breast: Secondary | ICD-10-CM | POA: Diagnosis not present

## 2015-11-25 DIAGNOSIS — F329 Major depressive disorder, single episode, unspecified: Secondary | ICD-10-CM | POA: Diagnosis not present

## 2015-11-25 DIAGNOSIS — Z1231 Encounter for screening mammogram for malignant neoplasm of breast: Secondary | ICD-10-CM | POA: Diagnosis not present

## 2015-12-03 DIAGNOSIS — Z6836 Body mass index (BMI) 36.0-36.9, adult: Secondary | ICD-10-CM | POA: Diagnosis not present

## 2015-12-03 DIAGNOSIS — J019 Acute sinusitis, unspecified: Secondary | ICD-10-CM | POA: Diagnosis not present

## 2016-01-29 DIAGNOSIS — M5030 Other cervical disc degeneration, unspecified cervical region: Secondary | ICD-10-CM | POA: Diagnosis not present

## 2016-01-29 DIAGNOSIS — R5382 Chronic fatigue, unspecified: Secondary | ICD-10-CM | POA: Diagnosis not present

## 2016-01-29 DIAGNOSIS — R400 Somnolence: Secondary | ICD-10-CM | POA: Diagnosis not present

## 2016-01-29 DIAGNOSIS — M5134 Other intervertebral disc degeneration, thoracic region: Secondary | ICD-10-CM | POA: Diagnosis not present

## 2016-01-29 DIAGNOSIS — M4696 Unspecified inflammatory spondylopathy, lumbar region: Secondary | ICD-10-CM | POA: Diagnosis not present

## 2016-01-29 DIAGNOSIS — Z6836 Body mass index (BMI) 36.0-36.9, adult: Secondary | ICD-10-CM | POA: Diagnosis not present

## 2016-02-10 DIAGNOSIS — M5134 Other intervertebral disc degeneration, thoracic region: Secondary | ICD-10-CM | POA: Diagnosis not present

## 2016-02-10 DIAGNOSIS — M5030 Other cervical disc degeneration, unspecified cervical region: Secondary | ICD-10-CM | POA: Diagnosis not present

## 2016-02-10 DIAGNOSIS — M546 Pain in thoracic spine: Secondary | ICD-10-CM | POA: Diagnosis not present

## 2016-02-12 ENCOUNTER — Telehealth: Payer: Self-pay | Admitting: Neurology

## 2016-02-12 NOTE — Telephone Encounter (Signed)
Pt's daughter called wanting to know what provider Dr A wanted the pt to see for neurocognitive testing.  Please call

## 2016-02-13 DIAGNOSIS — M5030 Other cervical disc degeneration, unspecified cervical region: Secondary | ICD-10-CM | POA: Diagnosis not present

## 2016-02-13 DIAGNOSIS — M546 Pain in thoracic spine: Secondary | ICD-10-CM | POA: Diagnosis not present

## 2016-02-13 DIAGNOSIS — M5134 Other intervertebral disc degeneration, thoracic region: Secondary | ICD-10-CM | POA: Diagnosis not present

## 2016-02-13 NOTE — Telephone Encounter (Signed)
I think we sent that to Dr. Vikki Ports last year, he is in [place]

## 2016-02-17 DIAGNOSIS — M546 Pain in thoracic spine: Secondary | ICD-10-CM | POA: Diagnosis not present

## 2016-02-17 DIAGNOSIS — M5030 Other cervical disc degeneration, unspecified cervical region: Secondary | ICD-10-CM | POA: Diagnosis not present

## 2016-02-17 DIAGNOSIS — M5134 Other intervertebral disc degeneration, thoracic region: Secondary | ICD-10-CM | POA: Diagnosis not present

## 2016-02-17 NOTE — Telephone Encounter (Signed)
Referral has been resent to Dr. McDermott's office.

## 2016-02-17 NOTE — Telephone Encounter (Signed)
Per Dr. Vikki Ports office patient never responded . Dr. Vikki Ports office will call daughter to schedule.

## 2016-02-19 DIAGNOSIS — M546 Pain in thoracic spine: Secondary | ICD-10-CM | POA: Diagnosis not present

## 2016-02-19 DIAGNOSIS — M5030 Other cervical disc degeneration, unspecified cervical region: Secondary | ICD-10-CM | POA: Diagnosis not present

## 2016-02-19 DIAGNOSIS — M5134 Other intervertebral disc degeneration, thoracic region: Secondary | ICD-10-CM | POA: Diagnosis not present

## 2016-02-24 DIAGNOSIS — M5134 Other intervertebral disc degeneration, thoracic region: Secondary | ICD-10-CM | POA: Diagnosis not present

## 2016-02-24 DIAGNOSIS — M5030 Other cervical disc degeneration, unspecified cervical region: Secondary | ICD-10-CM | POA: Diagnosis not present

## 2016-02-24 DIAGNOSIS — M546 Pain in thoracic spine: Secondary | ICD-10-CM | POA: Diagnosis not present

## 2016-02-26 DIAGNOSIS — M5134 Other intervertebral disc degeneration, thoracic region: Secondary | ICD-10-CM | POA: Diagnosis not present

## 2016-02-26 DIAGNOSIS — M5030 Other cervical disc degeneration, unspecified cervical region: Secondary | ICD-10-CM | POA: Diagnosis not present

## 2016-02-26 DIAGNOSIS — M546 Pain in thoracic spine: Secondary | ICD-10-CM | POA: Diagnosis not present

## 2016-03-02 DIAGNOSIS — M5134 Other intervertebral disc degeneration, thoracic region: Secondary | ICD-10-CM | POA: Diagnosis not present

## 2016-03-02 DIAGNOSIS — M5030 Other cervical disc degeneration, unspecified cervical region: Secondary | ICD-10-CM | POA: Diagnosis not present

## 2016-03-02 DIAGNOSIS — M546 Pain in thoracic spine: Secondary | ICD-10-CM | POA: Diagnosis not present

## 2016-03-04 DIAGNOSIS — M7552 Bursitis of left shoulder: Secondary | ICD-10-CM | POA: Diagnosis not present

## 2016-03-05 DIAGNOSIS — M546 Pain in thoracic spine: Secondary | ICD-10-CM | POA: Diagnosis not present

## 2016-03-05 DIAGNOSIS — M5030 Other cervical disc degeneration, unspecified cervical region: Secondary | ICD-10-CM | POA: Diagnosis not present

## 2016-03-05 DIAGNOSIS — M5134 Other intervertebral disc degeneration, thoracic region: Secondary | ICD-10-CM | POA: Diagnosis not present

## 2016-03-09 ENCOUNTER — Ambulatory Visit: Payer: Commercial Managed Care - HMO | Admitting: Neurology

## 2016-03-09 DIAGNOSIS — M546 Pain in thoracic spine: Secondary | ICD-10-CM | POA: Diagnosis not present

## 2016-03-09 DIAGNOSIS — M5134 Other intervertebral disc degeneration, thoracic region: Secondary | ICD-10-CM | POA: Diagnosis not present

## 2016-03-09 DIAGNOSIS — M5030 Other cervical disc degeneration, unspecified cervical region: Secondary | ICD-10-CM | POA: Diagnosis not present

## 2016-03-12 DIAGNOSIS — M5134 Other intervertebral disc degeneration, thoracic region: Secondary | ICD-10-CM | POA: Diagnosis not present

## 2016-03-12 DIAGNOSIS — F418 Other specified anxiety disorders: Secondary | ICD-10-CM | POA: Diagnosis not present

## 2016-03-12 DIAGNOSIS — M4724 Other spondylosis with radiculopathy, thoracic region: Secondary | ICD-10-CM | POA: Diagnosis not present

## 2016-03-12 DIAGNOSIS — M546 Pain in thoracic spine: Secondary | ICD-10-CM | POA: Diagnosis not present

## 2016-03-12 DIAGNOSIS — M47817 Spondylosis without myelopathy or radiculopathy, lumbosacral region: Secondary | ICD-10-CM | POA: Diagnosis not present

## 2016-03-12 DIAGNOSIS — M5030 Other cervical disc degeneration, unspecified cervical region: Secondary | ICD-10-CM | POA: Diagnosis not present

## 2016-03-19 DIAGNOSIS — M5134 Other intervertebral disc degeneration, thoracic region: Secondary | ICD-10-CM | POA: Diagnosis not present

## 2016-03-19 DIAGNOSIS — M5030 Other cervical disc degeneration, unspecified cervical region: Secondary | ICD-10-CM | POA: Diagnosis not present

## 2016-03-19 DIAGNOSIS — M546 Pain in thoracic spine: Secondary | ICD-10-CM | POA: Diagnosis not present

## 2016-03-24 DIAGNOSIS — C50911 Malignant neoplasm of unspecified site of right female breast: Secondary | ICD-10-CM | POA: Diagnosis not present

## 2016-03-25 DIAGNOSIS — M5134 Other intervertebral disc degeneration, thoracic region: Secondary | ICD-10-CM | POA: Diagnosis not present

## 2016-03-25 DIAGNOSIS — M546 Pain in thoracic spine: Secondary | ICD-10-CM | POA: Diagnosis not present

## 2016-03-25 DIAGNOSIS — M5030 Other cervical disc degeneration, unspecified cervical region: Secondary | ICD-10-CM | POA: Diagnosis not present

## 2016-04-02 DIAGNOSIS — R531 Weakness: Secondary | ICD-10-CM | POA: Diagnosis not present

## 2016-04-02 DIAGNOSIS — R251 Tremor, unspecified: Secondary | ICD-10-CM | POA: Diagnosis not present

## 2016-04-02 DIAGNOSIS — Z9181 History of falling: Secondary | ICD-10-CM | POA: Diagnosis not present

## 2016-04-02 DIAGNOSIS — Z6834 Body mass index (BMI) 34.0-34.9, adult: Secondary | ICD-10-CM | POA: Diagnosis not present

## 2016-04-02 DIAGNOSIS — F418 Other specified anxiety disorders: Secondary | ICD-10-CM | POA: Diagnosis not present

## 2016-04-02 DIAGNOSIS — E538 Deficiency of other specified B group vitamins: Secondary | ICD-10-CM | POA: Diagnosis not present

## 2016-04-07 ENCOUNTER — Telehealth: Payer: Self-pay

## 2016-04-07 DIAGNOSIS — R413 Other amnesia: Secondary | ICD-10-CM | POA: Diagnosis not present

## 2016-04-07 NOTE — Telephone Encounter (Signed)
Received faxed lab results from [facility]. CBC w/ diff/plt, CMP, thyroid panel w/ TSH all w/I normal limits. Vitamin B12 > 2000. Placed in Dr. Cathren Laine box for review. Copy to medical records to be scanned into chart.

## 2016-04-09 DIAGNOSIS — R413 Other amnesia: Secondary | ICD-10-CM | POA: Diagnosis not present

## 2016-04-09 DIAGNOSIS — C50911 Malignant neoplasm of unspecified site of right female breast: Secondary | ICD-10-CM | POA: Diagnosis not present

## 2016-04-09 DIAGNOSIS — R251 Tremor, unspecified: Secondary | ICD-10-CM | POA: Diagnosis not present

## 2016-04-13 DIAGNOSIS — H5201 Hypermetropia, right eye: Secondary | ICD-10-CM | POA: Diagnosis not present

## 2016-04-13 DIAGNOSIS — H353121 Nonexudative age-related macular degeneration, left eye, early dry stage: Secondary | ICD-10-CM | POA: Diagnosis not present

## 2016-04-13 DIAGNOSIS — R413 Other amnesia: Secondary | ICD-10-CM | POA: Diagnosis not present

## 2016-04-24 DIAGNOSIS — R413 Other amnesia: Secondary | ICD-10-CM | POA: Diagnosis not present

## 2016-04-28 DIAGNOSIS — R413 Other amnesia: Secondary | ICD-10-CM | POA: Diagnosis not present

## 2016-04-28 DIAGNOSIS — R251 Tremor, unspecified: Secondary | ICD-10-CM | POA: Diagnosis not present

## 2016-05-01 DIAGNOSIS — F0280 Dementia in other diseases classified elsewhere without behavioral disturbance: Secondary | ICD-10-CM | POA: Diagnosis not present

## 2016-05-01 DIAGNOSIS — G20 Parkinson's disease: Secondary | ICD-10-CM | POA: Diagnosis not present

## 2016-05-06 ENCOUNTER — Other Ambulatory Visit (HOSPITAL_COMMUNITY): Payer: Self-pay | Admitting: Psychiatry

## 2016-05-11 ENCOUNTER — Ambulatory Visit: Payer: Commercial Managed Care - HMO | Admitting: Neurology

## 2016-05-25 DIAGNOSIS — C50911 Malignant neoplasm of unspecified site of right female breast: Secondary | ICD-10-CM | POA: Diagnosis not present

## 2016-06-04 DIAGNOSIS — M25562 Pain in left knee: Secondary | ICD-10-CM | POA: Diagnosis not present

## 2016-06-04 DIAGNOSIS — Z6833 Body mass index (BMI) 33.0-33.9, adult: Secondary | ICD-10-CM | POA: Diagnosis not present

## 2016-06-11 DIAGNOSIS — G20 Parkinson's disease: Secondary | ICD-10-CM | POA: Diagnosis not present

## 2016-06-11 DIAGNOSIS — F0280 Dementia in other diseases classified elsewhere without behavioral disturbance: Secondary | ICD-10-CM | POA: Diagnosis not present

## 2016-06-26 DIAGNOSIS — G20 Parkinson's disease: Secondary | ICD-10-CM | POA: Diagnosis not present

## 2016-06-26 DIAGNOSIS — R410 Disorientation, unspecified: Secondary | ICD-10-CM | POA: Diagnosis not present

## 2016-06-26 DIAGNOSIS — Z6833 Body mass index (BMI) 33.0-33.9, adult: Secondary | ICD-10-CM | POA: Diagnosis not present

## 2016-07-06 DIAGNOSIS — F0280 Dementia in other diseases classified elsewhere without behavioral disturbance: Secondary | ICD-10-CM | POA: Diagnosis not present

## 2016-07-06 DIAGNOSIS — G20 Parkinson's disease: Secondary | ICD-10-CM | POA: Diagnosis not present

## 2016-08-19 DIAGNOSIS — F0280 Dementia in other diseases classified elsewhere without behavioral disturbance: Secondary | ICD-10-CM | POA: Diagnosis not present

## 2016-08-19 DIAGNOSIS — G20 Parkinson's disease: Secondary | ICD-10-CM | POA: Diagnosis not present

## 2016-08-21 DIAGNOSIS — M1712 Unilateral primary osteoarthritis, left knee: Secondary | ICD-10-CM | POA: Diagnosis not present

## 2016-08-27 DIAGNOSIS — M6281 Muscle weakness (generalized): Secondary | ICD-10-CM | POA: Diagnosis not present

## 2016-08-27 DIAGNOSIS — M1712 Unilateral primary osteoarthritis, left knee: Secondary | ICD-10-CM | POA: Diagnosis not present

## 2016-08-27 DIAGNOSIS — R2689 Other abnormalities of gait and mobility: Secondary | ICD-10-CM | POA: Diagnosis not present

## 2016-08-27 DIAGNOSIS — M25562 Pain in left knee: Secondary | ICD-10-CM | POA: Diagnosis not present

## 2016-08-27 IMAGING — CT CT HEAD W/O CM
1 of 2 series · 15 of 30 positions shown, 19 images · non-contrast
Comparison: CT of the head performed 03/13/2015

CLINICAL DATA: Acute onset of confusion.  Initial encounter.

EXAM:
CT HEAD WITHOUT CONTRAST
TECHNIQUE: Contiguous axial images were obtained from the base of the skull
through the vertex without intravenous contrast.

[Series 2: head wo · axial · 0.44mm/px · z∈[-122,+13]mm · 15 of 36 slices shown, 19 images]
[im 3/36  brain]
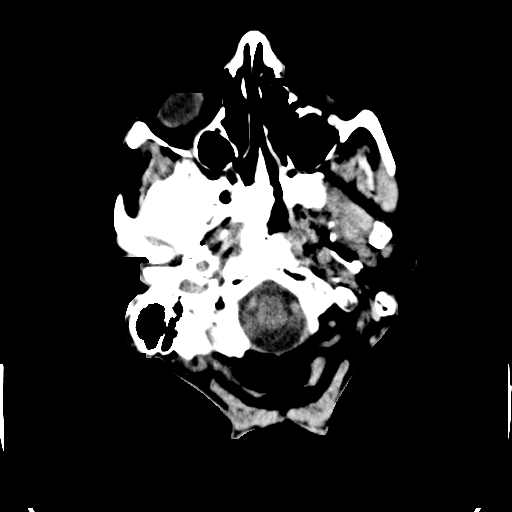
[im 3/36  bone]
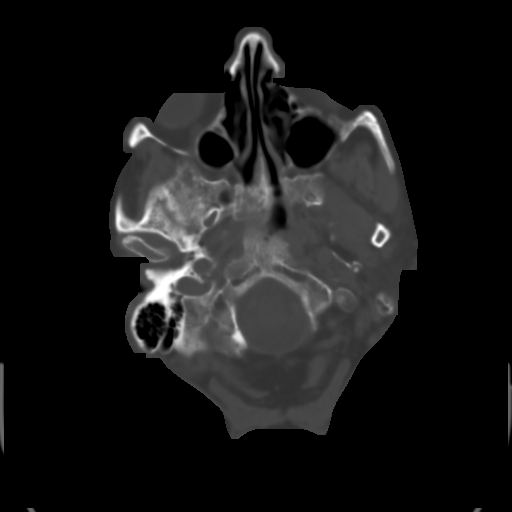
[im 5/36  brain]
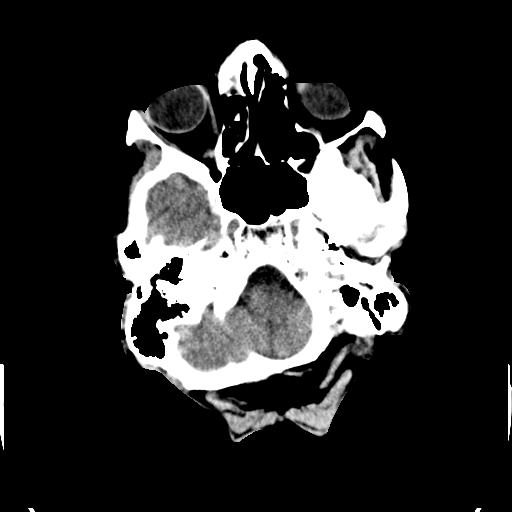
[im 7/36  brain]
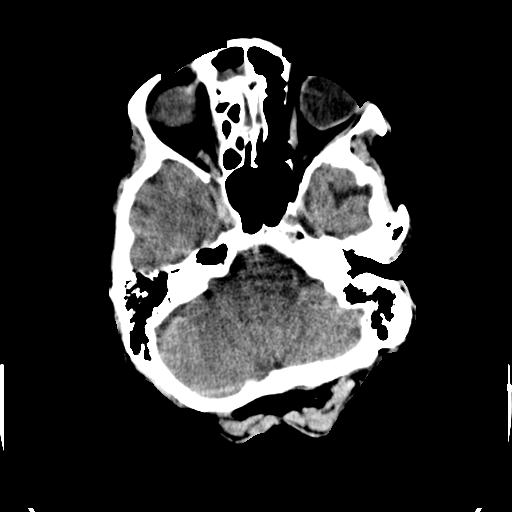
[im 9/36  brain]
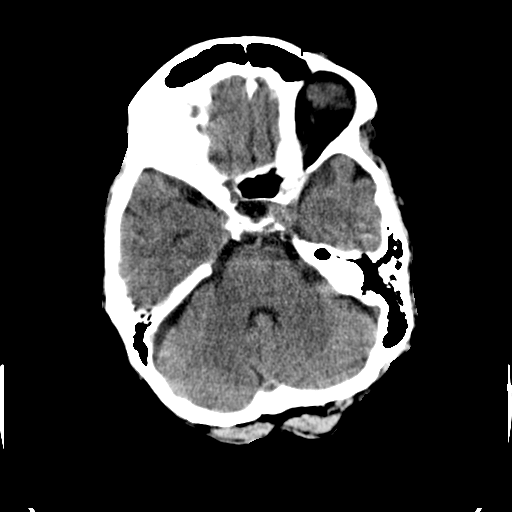
[im 11/36  brain]
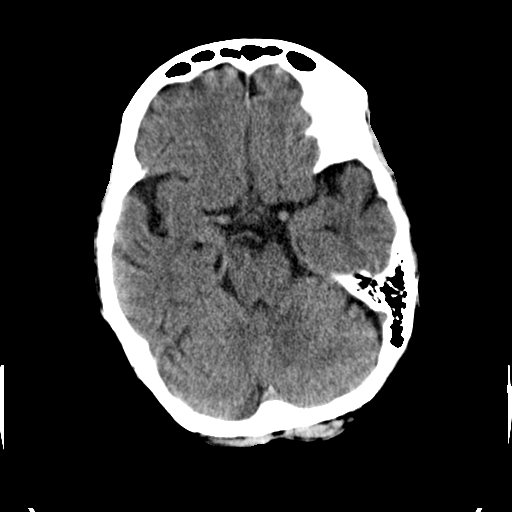
[im 11/36  bone]
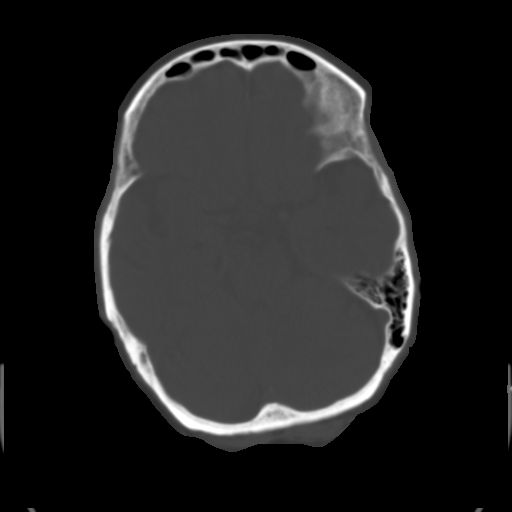
[im 14/36  brain]
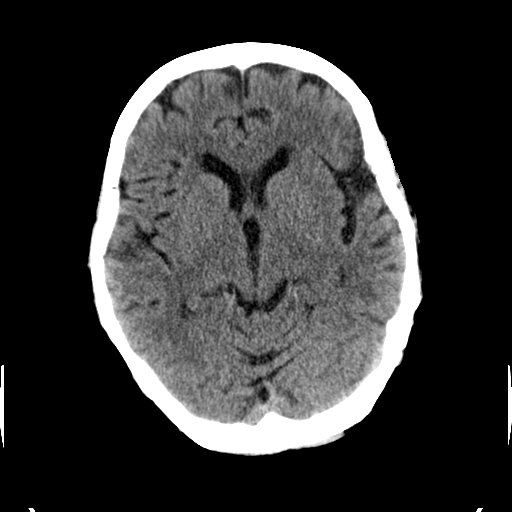
[im 16/36  brain]
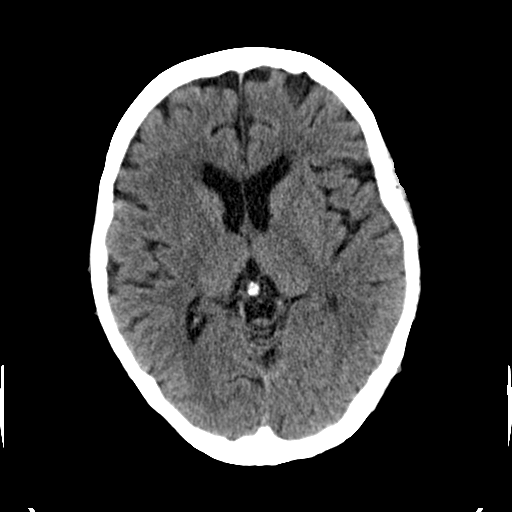
[im 18/36  brain]
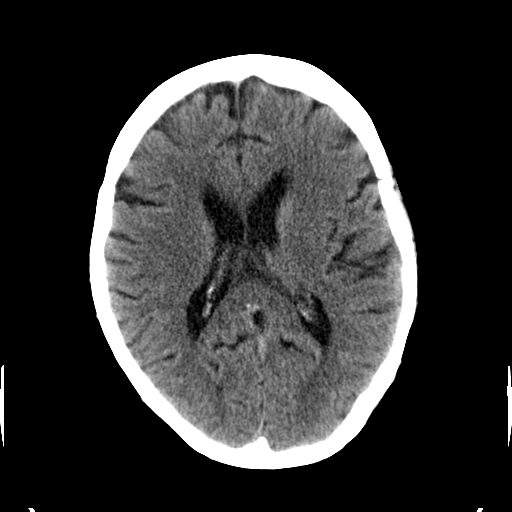
[im 20/36  brain]
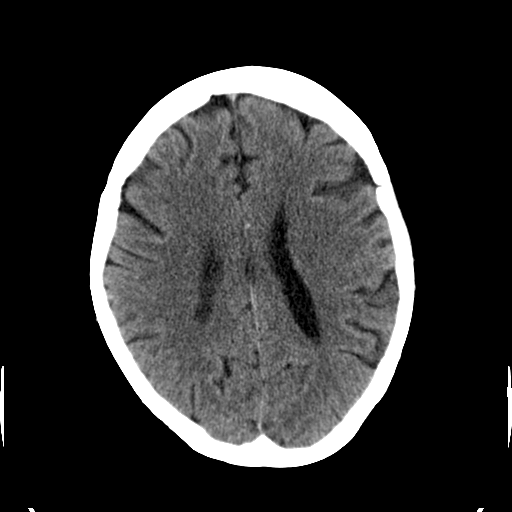
[im 20/36  bone]
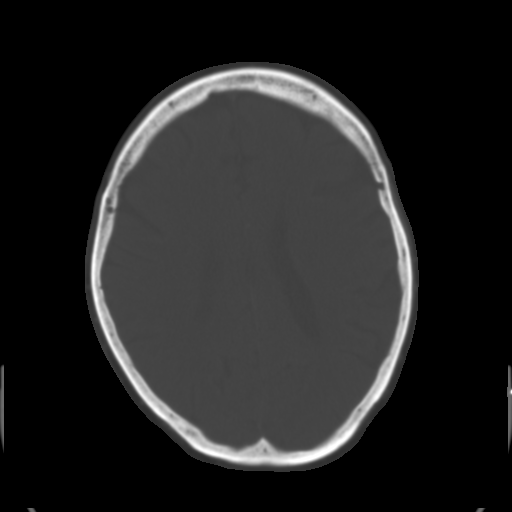
[im 22/36  brain]
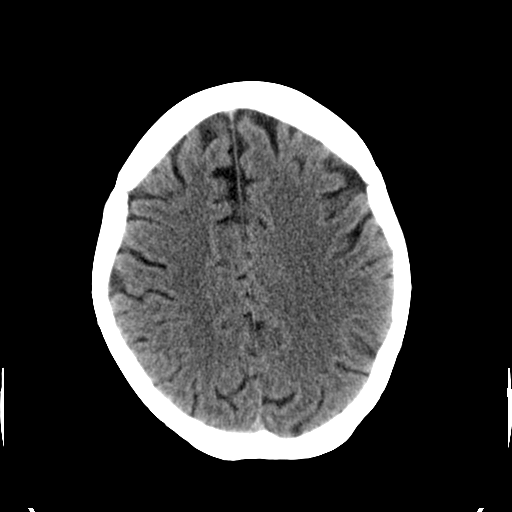
[im 25/36  brain]
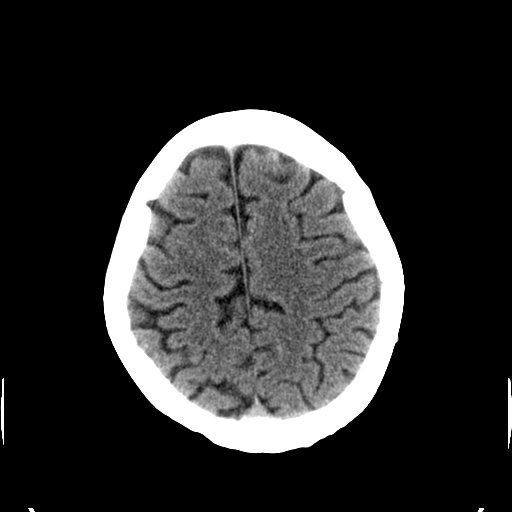
[im 27/36  brain]
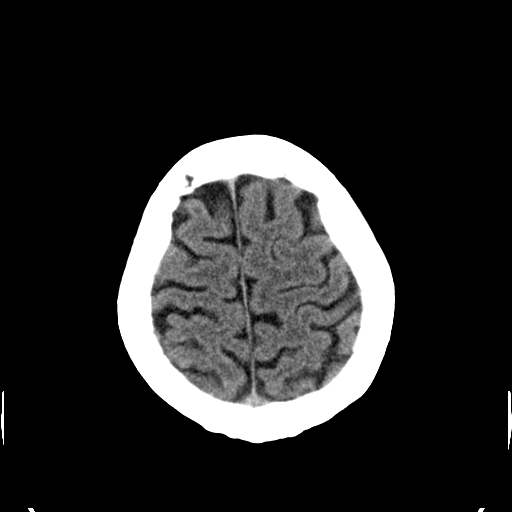
[im 29/36  brain]
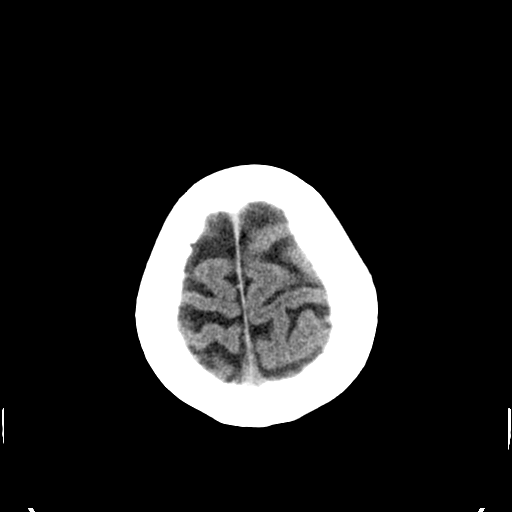
[im 29/36  bone]
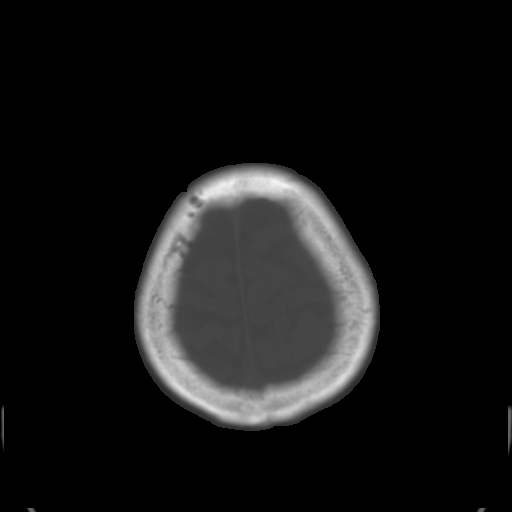
[im 31/36  brain]
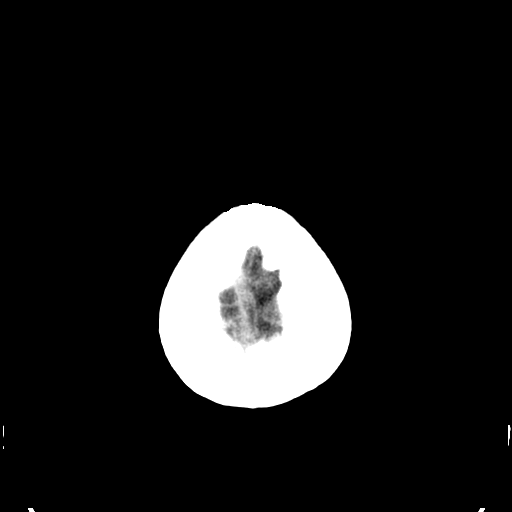
[im 33/36  brain]
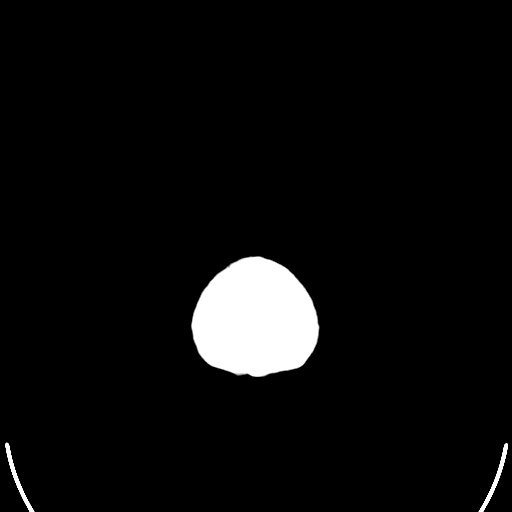

[15 of 30 positions shown; findings below may reference images not displayed]

FINDINGS: There is no evidence of acute infarction, mass lesion, or intra- or
extra-axial hemorrhage on CT.

Mild periventricular white matter change likely reflects small
vessel ischemic microangiopathy.

The posterior fossa, including the cerebellum, brainstem and fourth
ventricle, is within normal limits. The third and lateral
ventricles, and basal ganglia are unremarkable in appearance. The
cerebral hemispheres are symmetric in appearance, with normal
gray-white differentiation. No mass effect or midline shift is seen.

There is no evidence of fracture; visualized osseous structures are
unremarkable in appearance. The visualized portions of the orbits
are within normal limits. The paranasal sinuses and mastoid air
cells are well-aerated. No significant soft tissue abnormalities are
seen.
IMPRESSION: 1. No acute intracranial pathology seen on CT.
2. Mild small vessel ischemic microangiopathy.

## 2016-08-31 DIAGNOSIS — R42 Dizziness and giddiness: Secondary | ICD-10-CM | POA: Diagnosis not present

## 2016-08-31 DIAGNOSIS — I959 Hypotension, unspecified: Secondary | ICD-10-CM | POA: Diagnosis not present

## 2016-09-03 DIAGNOSIS — R251 Tremor, unspecified: Secondary | ICD-10-CM | POA: Diagnosis not present

## 2016-09-03 DIAGNOSIS — M6281 Muscle weakness (generalized): Secondary | ICD-10-CM | POA: Diagnosis not present

## 2016-09-03 DIAGNOSIS — F418 Other specified anxiety disorders: Secondary | ICD-10-CM | POA: Diagnosis not present

## 2016-09-03 DIAGNOSIS — Z6832 Body mass index (BMI) 32.0-32.9, adult: Secondary | ICD-10-CM | POA: Diagnosis not present

## 2016-09-03 DIAGNOSIS — M5134 Other intervertebral disc degeneration, thoracic region: Secondary | ICD-10-CM | POA: Diagnosis not present

## 2016-09-03 DIAGNOSIS — R2689 Other abnormalities of gait and mobility: Secondary | ICD-10-CM | POA: Diagnosis not present

## 2016-09-03 DIAGNOSIS — M1712 Unilateral primary osteoarthritis, left knee: Secondary | ICD-10-CM | POA: Diagnosis not present

## 2016-09-03 DIAGNOSIS — M4684 Other specified inflammatory spondylopathies, thoracic region: Secondary | ICD-10-CM | POA: Diagnosis not present

## 2016-09-03 DIAGNOSIS — M25562 Pain in left knee: Secondary | ICD-10-CM | POA: Diagnosis not present

## 2016-09-08 DIAGNOSIS — M1712 Unilateral primary osteoarthritis, left knee: Secondary | ICD-10-CM | POA: Diagnosis not present

## 2016-09-08 DIAGNOSIS — M6281 Muscle weakness (generalized): Secondary | ICD-10-CM | POA: Diagnosis not present

## 2016-09-08 DIAGNOSIS — M25562 Pain in left knee: Secondary | ICD-10-CM | POA: Diagnosis not present

## 2016-09-08 DIAGNOSIS — R2689 Other abnormalities of gait and mobility: Secondary | ICD-10-CM | POA: Diagnosis not present

## 2016-09-10 DIAGNOSIS — M25562 Pain in left knee: Secondary | ICD-10-CM | POA: Diagnosis not present

## 2016-09-10 DIAGNOSIS — R2689 Other abnormalities of gait and mobility: Secondary | ICD-10-CM | POA: Diagnosis not present

## 2016-09-10 DIAGNOSIS — M6281 Muscle weakness (generalized): Secondary | ICD-10-CM | POA: Diagnosis not present

## 2016-09-10 DIAGNOSIS — M1712 Unilateral primary osteoarthritis, left knee: Secondary | ICD-10-CM | POA: Diagnosis not present

## 2016-09-16 DIAGNOSIS — F331 Major depressive disorder, recurrent, moderate: Secondary | ICD-10-CM | POA: Diagnosis not present

## 2016-09-22 DIAGNOSIS — F331 Major depressive disorder, recurrent, moderate: Secondary | ICD-10-CM | POA: Diagnosis not present

## 2016-09-28 DIAGNOSIS — M1712 Unilateral primary osteoarthritis, left knee: Secondary | ICD-10-CM | POA: Diagnosis not present

## 2016-09-30 ENCOUNTER — Encounter: Payer: Self-pay | Admitting: Neurology

## 2016-10-03 DIAGNOSIS — E785 Hyperlipidemia, unspecified: Secondary | ICD-10-CM | POA: Diagnosis not present

## 2016-10-03 DIAGNOSIS — C50911 Malignant neoplasm of unspecified site of right female breast: Secondary | ICD-10-CM | POA: Diagnosis not present

## 2016-10-03 DIAGNOSIS — G20 Parkinson's disease: Secondary | ICD-10-CM | POA: Diagnosis not present

## 2016-10-03 DIAGNOSIS — M8589 Other specified disorders of bone density and structure, multiple sites: Secondary | ICD-10-CM | POA: Diagnosis not present

## 2016-10-03 DIAGNOSIS — Z1211 Encounter for screening for malignant neoplasm of colon: Secondary | ICD-10-CM | POA: Diagnosis not present

## 2016-10-03 DIAGNOSIS — Z23 Encounter for immunization: Secondary | ICD-10-CM | POA: Diagnosis not present

## 2016-10-12 DIAGNOSIS — R2689 Other abnormalities of gait and mobility: Secondary | ICD-10-CM | POA: Diagnosis not present

## 2016-10-12 DIAGNOSIS — H6121 Impacted cerumen, right ear: Secondary | ICD-10-CM | POA: Diagnosis not present

## 2016-10-12 DIAGNOSIS — Z6832 Body mass index (BMI) 32.0-32.9, adult: Secondary | ICD-10-CM | POA: Diagnosis not present

## 2016-10-12 DIAGNOSIS — G20 Parkinson's disease: Secondary | ICD-10-CM | POA: Diagnosis not present

## 2016-10-23 DIAGNOSIS — R2689 Other abnormalities of gait and mobility: Secondary | ICD-10-CM | POA: Diagnosis not present

## 2016-10-23 DIAGNOSIS — M25562 Pain in left knee: Secondary | ICD-10-CM | POA: Diagnosis not present

## 2016-10-23 DIAGNOSIS — M6281 Muscle weakness (generalized): Secondary | ICD-10-CM | POA: Diagnosis not present

## 2016-10-23 DIAGNOSIS — M1712 Unilateral primary osteoarthritis, left knee: Secondary | ICD-10-CM | POA: Diagnosis not present

## 2016-10-27 DIAGNOSIS — M1712 Unilateral primary osteoarthritis, left knee: Secondary | ICD-10-CM | POA: Diagnosis not present

## 2016-10-27 DIAGNOSIS — M6281 Muscle weakness (generalized): Secondary | ICD-10-CM | POA: Diagnosis not present

## 2016-10-27 DIAGNOSIS — R2689 Other abnormalities of gait and mobility: Secondary | ICD-10-CM | POA: Diagnosis not present

## 2016-10-27 DIAGNOSIS — M25562 Pain in left knee: Secondary | ICD-10-CM | POA: Diagnosis not present

## 2016-10-27 NOTE — Progress Notes (Signed)
Katherine Frye was seen today in the movement disorders clinic for neurologic consultation at the request of Katherine Dress, MD.  The consultation is for the evaluation of Parkinson's disease.  I have reviewed prior records made available to me.  The patient has seen both Dr. [name] and Dr. [name].  Records indicate that her primary care physician is unsure that the patient has Parkinson's disease.  Patient started to see Dr. [name] in May, 2016 for memory loss and tremor.  Dr. [name] indicated that there was no evidence of Parkinson's on that examination.  Over the course of time with seeing Dr. [name], the patient had more complaints of memory loss which were felt to to depression but neurocognitive testing was recommended at [facility].  This was ultimately done in April, 2018.  This demonstrated "major neurocognitive disorder", which was defined as dementia possibly secondary to Parkinson's disease or Parkinson's plus syndrome with possible comorbid Alzheimer's disease.  It also noted depression and anxiety.  She was also worked up for the evaluation of seizure because of mental status change and EEGs were negative.  She transferred care to Dr. [name] in April, 2018 and he felt she had a parkinsonian syndrome and recommended a DaT scan.  DaT scan was done on April 28, 2016 at [facility] and was reported to show Abnormal image grade 2/3: Minimal uptake in the caudate heads bilaterally with no putamen uptake.  Dr. [name] started her on Azilect and neupro just a few days later.  She was started on carbidopa/levodopa 25/100 in 07/2016.  She ultimately d/c the neupro due to hallucinations and went up on carbidopa/levodopa 25/100 to qid.  She last saw Dr. [name] in 08/2016.  She has stopped carbidopa/levodopa right after seeing him because "nobody thought it was helpful at the time" but the tremor is much worse now that the medication is gone.  Pt thought that it also caused myalgia but she still has that.      Specific Symptoms:  Tremor: Yes.  , L arm and leg and R arm Family hx of similar:  No. Voice: no change Sleep: sleeping well  Vivid Dreams:  No.  Acting out dreams:  Yes.   , sleep talking only Wet Pillows: Yes.   Postural symptoms:  Yes.  , but minimal per family  Falls?  No. Bradykinesia symptoms: difficulty getting out of a chair Loss of smell:  No. Loss of taste:  Yes.   Urinary Incontinence:  No. Difficulty Swallowing:  No. Handwriting, micrographia: Yes.   Trouble with ADL's:  Yes.  , just slower than in the past and trouble getting arms in shirts  Trouble buttoning clothing: No. Depression:  No., but admits to frustration Memory changes:  Yes.  , lives with husband and his memory is good (he does finances/he does driving/daughter prepares pillbox x 1 year because of confusion and husband will then remind her to take med) Hallucinations:  Yes.  , with neupro patch  visual distortions: Yes.   N/V:  No. Lightheaded:  Yes.    Syncope: Yes.   but not for 1.5 years Diplopia:  No. but "weak eye on the right" for a year (blurry vision) Dyskinesia:  No.  Neuroimaging of the brain has previously been performed.  It was done in 2016 and demonstrated mild small vessel disease.  PREVIOUS MEDICATIONS: azilect, neupro (hallucinations), carbidopa/levodopa 25/100 IR  ALLERGIES:   Allergies  Allergen Reactions  . Levaquin [Levofloxacin In D5w] Nausea And Vomiting  Also dizziness and headaches    CURRENT MEDICATIONS:  Outpatient Encounter Prescriptions as of 10/29/2016  Medication Sig  . BIOTIN PO Take by mouth daily.  . Calcium Carb-Cholecalciferol (CALCIUM-VITAMIN D3) 600-400 MG-UNIT CAPS Take 1 tablet by mouth daily. 1228m Ca and 1000units of vit D: For bone health  . diclofenac sodium (VOLTAREN) 1 % GEL Apply 2 g topically 4 (four) times daily. For arthritis pain  . DULoxetine (CYMBALTA) 30 MG capsule Take 30 mg by mouth daily.  . DULoxetine (CYMBALTA) 60 MG capsule  Take 1 capsule (60 mg total) by mouth at bedtime. For depression  . Multiple Vitamin (MULTIVITAMIN WITH MINERALS) TABS tablet Take 1 tablet by mouth at bedtime. For low Vitamin  . Multiple Vitamins-Minerals (ICAPS MV PO) Take by mouth daily.  . naproxen (NAPROSYN) 500 MG tablet Take 500 mg by mouth 2 (two) times daily with a meal.  . vitamin B-12 (CYANOCOBALAMIN) 1000 MCG tablet Take 1 tablet (1,000 mcg total) by mouth daily. For B-12 deficiency  . Vitamin D, Cholecalciferol, 1000 units CAPS Take 7,000 Units by mouth daily. For bone health  . [DISCONTINUED] meloxicam (MOBIC) 15 MG tablet Take 15 mg by mouth at bedtime.  . [DISCONTINUED] mirtazapine (REMERON) 7.5 MG tablet Take 1 tablet (7.5 mg total) by mouth at bedtime. For insomnia   No facility-administered encounter medications on file as of 10/29/2016.     PAST MEDICAL HISTORY:   Past Medical History:  Diagnosis Date  . Allergy 2010   medication and seasonal  . Cancer ([place] 2010   right breast  . Malignant neoplasm of upper-inner quadrant of female breast ([facility] 2010  . Obesity, unspecified 2013  . Solitary cyst of breast 2012   left  . Special screening for malignant neoplasms, colon 2013    PAST SURGICAL HISTORY:   Past Surgical History:  Procedure Laterality Date  . APPENDECTOMY    . BREAST BIOPSY Right 2010  . BREAST CYST EXCISION Right 2006   fibroid cyst  . BREAST SURGERY Right 2010   T2, N0, ER/PR positive, HER-2/neu negative, Oncotype recurrence score  of 10 ( 7% risk of recurrence)   . [place]OS[place]  [place] . FOOT SURGERY  1996  . KNEE ARTHROSCOPY Right 2012   May and October  . KNEE SURGERY Right 2013   replacement  . NASAL SINUS SURGERY  1991  . SKIN CANCER EXCISION  2013   basel cell on forehead  . TONSILLECTOMY    . TUBAL LIGATION    . UPPER GI ENDOSCOPY  2010    SOCIAL HISTORY:   Social History   Social History  . Marital status: Married    Spouse name: Katherine Frye"Vic"   . Number of children: 2  . Years of education: 12   Occupational History  . Not on file.   Social History Main Topics  . Smoking status: Never Smoker  . Smokeless tobacco: Never Used  . Alcohol use No  . Drug use: No  . Sexual activity: Not on file   Other Topics Concern  . Not on file   Social History Narrative   Lives at home with husband.   Caffeine use: Drinks 1 cup coffee per day (sometimes 2 cups per day)       FAMILY HISTORY:   Family Status  Relation Status  . Sister Alive  . Mother Deceased  . Father Deceased  . Brother Alive  . Sister Deceased  . Daughter  Alive    ROS:  A complete 10 system review of systems was obtained and was unremarkable apart from what is mentioned above.  PHYSICAL EXAMINATION:    VITALS:   Vitals:   10/29/16 0938  BP: 110/80  Pulse: 62  SpO2: 95%  Weight: 210 lb (95.3 kg)  Height: 5' 10"  (1.778 m)    GEN:  The patient appears stated age and is in NAD. HEENT:  Normocephalic, atraumatic.  The mucous membranes are moist. The superficial temporal arteries are without ropiness or tenderness. CV:  RRR Lungs:  CTAB Neck/HEME:  There are no carotid bruits bilaterally.  Neurological examination:  Orientation:  [place] Cognitive Assessment  10/29/2016 06/05/2014  Visuospatial/ Executive (0/5) 2 3  Naming (0/3) 2 2  Attention: Read list of digits (0/2) 0 2  Attention: Read list of letters (0/1) 1 1  Attention: Serial 7 subtraction starting at 100 (0/3) 1 2  Language: Repeat phrase (0/2) 0 2  Language : Fluency (0/1) 1 1  Abstraction (0/2) 1 2  Delayed Recall (0/5) 2 4  Orientation (0/6) 5 6  Total 15 25  Adjusted Score (based on education) 16 26    Cranial nerves: There is good facial symmetry. There is facial hypomimia.  Pupils are equal round and reactive to light bilaterally. Fundoscopic exam reveals clear margins bilaterally. Extraocular muscles are intact. The visual fields are full to confrontational testing. The  speech is fluent and clear. Soft palate rises symmetrically and there is no tongue deviation. Hearing is intact to conversational tone. Sensation: Sensation is intact to light and pinprick throughout (facial, trunk, extremities). Vibration is intact at the bilateral big toe. There is no extinction with double simultaneous stimulation. There is no sensory dermatomal level identified. Motor: Strength is 5/5 in the bilateral upper and lower extremities.   Shoulder shrug is equal and symmetric.  There is no pronator drift. Deep tendon reflexes: Deep tendon reflexes are 2-/4 at the bilateral biceps, triceps, brachioradialis, patella and achilles. Plantar responses are downgoing bilaterally.  Movement examination: Tone: There is normal tone in the bilateral upper extremities.  The tone in the lower extremities is normal.  Abnormal movements: There is tremor in all 4 extremities, L more than right, Upper >Lower Coordination:  There is  decremation with RAM's, with any form of RAMS, including alternating supination and pronation of the forearm, hand opening and closing, finger taps, heel taps and toe taps, L more than right.   Gait and Station: The patient has  difficulty arising out of a deep-seated chair without the use of the hands and falls back after the first attempt.  She then pushes off of the chair.  Her stride length is actually quite good and she walks quickly down the hall.  She has a negative pull test.   ASSESSMENT/PLAN:  1.  Parkinsonism.  I suspect that this does represent idiopathic Parkinson's disease but Lewy body dementia cannot be ruled out.  I think that is lower on the list given her Moca was actually pretty good several years ago when she presented with symptoms.    -We discussed the diagnosis as well as pathophysiology of the disease.  We discussed treatment options as well as prognostic indicators.  Patient education was provided.  -We discussed that it used to be thought that  levodopa would increase risk of melanoma but now it is believed that Parkinsons itself likely increases risk of melanoma. she is to get regular skin checks.  -Greater than 50% of  the 60 minute visit was spent in counseling answering questions and talking about what to expect now as well as in the future.  We talked about medication options.  We talked about safety in the home.  -Patient was very tearful when talking to her.  We talked about proper expectations and reframing her expectations for living with the disease.  -She has been on levodopa before and thought she had side effects, but I am not sure all her complaints were really side effects (diarrhea, myalgias) as she is still complaining about myalgias even off of the medication.  She admits she has more tremor off of the medication.  I am going to restart levodopa, but will start this CR this time as sometimes patients with memory issues tolerate this better.  We will work to 1 tablet 3 times per day and see how she does.  Risks, benefits, side effects and alternative therapies were discussed.  The opportunity to ask questions was given and they were answered to the best of my ability.  The patient expressed understanding and willingness to follow the outlined treatment protocols.  -We discussed community resources in the area including patient support groups and community exercise programs for PD and pt education was provided to the patient.  -Patient's family asked me about surgical interventions and we discussed that she has not a surgical candidate because of memory change.  -Patient and her family met with our social worker today for Parkinson's.  2.  Parkinson's related dementia  -Patient has had neurocognitive testing at [facility] in April, 2018.  This demonstrated evidence of dementia.  The patient does have 24 hour/day care.  Her husband helps her significantly, including with medication.  3.  I will plan on seeing her back in the next  few months, sooner should new neurologic issues arise.  Time above did not include the 40 min of record review which was detailed above, which was non face to face time.   Cc:  Katherine Dress, MD

## 2016-10-29 ENCOUNTER — Ambulatory Visit (INDEPENDENT_AMBULATORY_CARE_PROVIDER_SITE_OTHER): Payer: Commercial Managed Care - HMO | Admitting: Neurology

## 2016-10-29 ENCOUNTER — Encounter: Payer: Self-pay | Admitting: Psychology

## 2016-10-29 ENCOUNTER — Encounter: Payer: Self-pay | Admitting: Neurology

## 2016-10-29 VITALS — BP 110/80 | HR 62 | Ht 70.0 in | Wt 210.0 lb

## 2016-10-29 DIAGNOSIS — G20 Parkinson's disease: Secondary | ICD-10-CM

## 2016-10-29 DIAGNOSIS — F0280 Dementia in other diseases classified elsewhere without behavioral disturbance: Secondary | ICD-10-CM

## 2016-10-29 MED ORDER — CARBIDOPA-LEVODOPA ER 25-100 MG PO TBCR: 1.0000 | EXTENDED_RELEASE_TABLET | Freq: Three times a day (TID) | ORAL | 4 refills | Status: DC

## 2016-10-29 NOTE — Progress Notes (Signed)
I met with the patient and her two daughters while they were in the clinic today. I provided them all information on Parkinson's resources. The family has taken her to a support group in [place] and are considering a exercise program.  The patient reports fear as her sister died from [place].  We talked a little bit amount the emotional stages of dealing with a new diagnosis and maximized the focus that she is not alone and that there may resources in our area that can help her finds ways to live well with her disease. I shared my contact information with her and the daughters. I am available to help as needed as she journeys through this new diagnosis.

## 2016-10-29 NOTE — Patient Instructions (Signed)
1. Start Carbidopa Levodopa 25/100 CR - 1 tablet by mouth twice daily for one week, then increase to three times daily.

## 2016-10-30 DIAGNOSIS — M1712 Unilateral primary osteoarthritis, left knee: Secondary | ICD-10-CM | POA: Diagnosis not present

## 2016-10-30 DIAGNOSIS — M6281 Muscle weakness (generalized): Secondary | ICD-10-CM | POA: Diagnosis not present

## 2016-10-30 DIAGNOSIS — R2689 Other abnormalities of gait and mobility: Secondary | ICD-10-CM | POA: Diagnosis not present

## 2016-10-30 DIAGNOSIS — M25562 Pain in left knee: Secondary | ICD-10-CM | POA: Diagnosis not present

## 2016-11-03 DIAGNOSIS — R2689 Other abnormalities of gait and mobility: Secondary | ICD-10-CM | POA: Diagnosis not present

## 2016-11-03 DIAGNOSIS — M1712 Unilateral primary osteoarthritis, left knee: Secondary | ICD-10-CM | POA: Diagnosis not present

## 2016-11-03 DIAGNOSIS — M25562 Pain in left knee: Secondary | ICD-10-CM | POA: Diagnosis not present

## 2016-11-03 DIAGNOSIS — M6281 Muscle weakness (generalized): Secondary | ICD-10-CM | POA: Diagnosis not present

## 2016-11-07 DIAGNOSIS — R197 Diarrhea, unspecified: Secondary | ICD-10-CM | POA: Diagnosis not present

## 2016-11-07 DIAGNOSIS — R1032 Left lower quadrant pain: Secondary | ICD-10-CM | POA: Diagnosis not present

## 2016-11-11 DIAGNOSIS — M25562 Pain in left knee: Secondary | ICD-10-CM | POA: Diagnosis not present

## 2016-11-11 DIAGNOSIS — R2689 Other abnormalities of gait and mobility: Secondary | ICD-10-CM | POA: Diagnosis not present

## 2016-11-11 DIAGNOSIS — M1712 Unilateral primary osteoarthritis, left knee: Secondary | ICD-10-CM | POA: Diagnosis not present

## 2016-11-11 DIAGNOSIS — M6281 Muscle weakness (generalized): Secondary | ICD-10-CM | POA: Diagnosis not present

## 2016-11-12 DIAGNOSIS — R197 Diarrhea, unspecified: Secondary | ICD-10-CM | POA: Diagnosis not present

## 2016-11-12 DIAGNOSIS — Z86010 Personal history of colonic polyps: Secondary | ICD-10-CM | POA: Diagnosis not present

## 2016-11-12 DIAGNOSIS — R1032 Left lower quadrant pain: Secondary | ICD-10-CM | POA: Diagnosis not present

## 2016-11-13 DIAGNOSIS — R2689 Other abnormalities of gait and mobility: Secondary | ICD-10-CM | POA: Diagnosis not present

## 2016-11-13 DIAGNOSIS — M6281 Muscle weakness (generalized): Secondary | ICD-10-CM | POA: Diagnosis not present

## 2016-11-13 DIAGNOSIS — M1712 Unilateral primary osteoarthritis, left knee: Secondary | ICD-10-CM | POA: Diagnosis not present

## 2016-11-13 DIAGNOSIS — M25562 Pain in left knee: Secondary | ICD-10-CM | POA: Diagnosis not present

## 2016-11-16 DIAGNOSIS — R2689 Other abnormalities of gait and mobility: Secondary | ICD-10-CM | POA: Diagnosis not present

## 2016-11-16 DIAGNOSIS — M25562 Pain in left knee: Secondary | ICD-10-CM | POA: Diagnosis not present

## 2016-11-16 DIAGNOSIS — M1712 Unilateral primary osteoarthritis, left knee: Secondary | ICD-10-CM | POA: Diagnosis not present

## 2016-11-16 DIAGNOSIS — M6281 Muscle weakness (generalized): Secondary | ICD-10-CM | POA: Diagnosis not present

## 2016-11-24 DIAGNOSIS — G44319 Acute post-traumatic headache, not intractable: Secondary | ICD-10-CM | POA: Diagnosis not present

## 2016-11-24 DIAGNOSIS — Z6831 Body mass index (BMI) 31.0-31.9, adult: Secondary | ICD-10-CM | POA: Diagnosis not present

## 2016-12-03 ENCOUNTER — Telehealth: Payer: Self-pay | Admitting: Neurology

## 2016-12-03 NOTE — Telephone Encounter (Signed)
Pt's daughter called and said Dr Tat put her on a new medication and is having diarrhea and mood changes, please call and advise

## 2016-12-04 NOTE — Telephone Encounter (Signed)
Spoke with patient's daughter and she states he is very concerned with her mother.   Patient has had a lot of issues with diarrhea since last visit (it looks like she had some prior to visit as well after reading note). She was in the hospital, saw a GI doctor and will have a colonoscopy next week to look at causes. She wanted to make sure this couldn't be coming from Carbidopa Levodopa, which I advised was unlikely. She states GI doctor didn't want to prescribe anything (other than patient taking imodium) because it would interact with Levodopa.   She was also concerned because she feels like her mother is not getting better, but has been getting worse daily since last visit at the end of October. States tremor is the same, maybe slightly worse, and complains of aches all over. The main concern is depression. She has seemed to get emotionally worse daily. She is sad all the time. Seems paranoid. Talks to the daughter a lot about making sure her living will is correct and that she will "be okay when [she's] gone". She seems paranoid and just down all the time. States symptoms she complained of with Carbidopa Levodopa IR (dizziness, light-headedness) are not present anymore on Carbidopa Levodopa CR.   Please advise.

## 2016-12-04 NOTE — Telephone Encounter (Signed)
Left message on machine for patient to call back.

## 2016-12-05 NOTE — Telephone Encounter (Signed)
I am having a bit of trouble figuring out exactly what the question is but let me see if I can try to help.  I agree (and did last visit) that depression is a big issue here.  I think that she needs psychiatry and would love to see her go (I cannot refer but happy to give her names and she can call herself as that is the way it is done here).  Levodopa does not cause depression, myalgia or diarrhea.  As for the GI doctor not wanting to prescribe medication for her because it interacts with carbidopa/levodopa, I wonder if they misunderstood.  There are no GI meds to my knowledge for diarrhea that interact with carbidopa/levodopa.  There are some that I don't like PD patients to take (reglan) but not that interact with carbidopa/levodopa.  Ask her daughter (if she is on DPR) what the question is - is she asking me to take her off of the levodopa?  If so, that is okay but it will not solve any of these issues.  Please make sure that the patient is not suicidal and has 24 hour/day care.

## 2016-12-07 DIAGNOSIS — Z86010 Personal history of colonic polyps: Secondary | ICD-10-CM | POA: Diagnosis not present

## 2016-12-07 DIAGNOSIS — K219 Gastro-esophageal reflux disease without esophagitis: Secondary | ICD-10-CM | POA: Diagnosis not present

## 2016-12-07 DIAGNOSIS — R1032 Left lower quadrant pain: Secondary | ICD-10-CM | POA: Diagnosis not present

## 2016-12-07 DIAGNOSIS — G20 Parkinson's disease: Secondary | ICD-10-CM | POA: Diagnosis not present

## 2016-12-07 DIAGNOSIS — F418 Other specified anxiety disorders: Secondary | ICD-10-CM | POA: Diagnosis not present

## 2016-12-07 DIAGNOSIS — K648 Other hemorrhoids: Secondary | ICD-10-CM | POA: Diagnosis not present

## 2016-12-07 DIAGNOSIS — E538 Deficiency of other specified B group vitamins: Secondary | ICD-10-CM | POA: Diagnosis not present

## 2016-12-07 DIAGNOSIS — K582 Mixed irritable bowel syndrome: Secondary | ICD-10-CM | POA: Diagnosis not present

## 2016-12-07 DIAGNOSIS — R197 Diarrhea, unspecified: Secondary | ICD-10-CM | POA: Diagnosis not present

## 2016-12-07 DIAGNOSIS — K5730 Diverticulosis of large intestine without perforation or abscess without bleeding: Secondary | ICD-10-CM | POA: Diagnosis not present

## 2016-12-07 DIAGNOSIS — F5104 Psychophysiologic insomnia: Secondary | ICD-10-CM | POA: Diagnosis not present

## 2016-12-07 NOTE — Telephone Encounter (Signed)
Spoke with patient's daughter who is on [place]. She states that GI MD did put patient on Bentyl 10 mg BID. She did have her colonoscopy today that was normal.  Patient did see a psychiatrist in the past, but states all that was done was increasing Cymbalta. She states that is the only depression medication she has been on. States not suicidal. I offered to provide names as suggestions for them to try and she would like that (they live in [place] and have seen someone in [place] in the past). Please advise on suggestions.   I passed along the message about Carbidopa Levodopa and it sounds like they are undecided whether they want her to discontinue medication or not. I did let them know if they wanted to stop medication that was okay, but they will think this over. She thinks tremors are worse, but not sure if she just doesn't have enough medication.   A new concern she brought up today was rapidly declining memory issues. She states her confusion has been rapidly worse the last month. She does have 24/7 care with husband, but he is elderly as well, and they are concerned she may need more care if patient keeps declining. She did have neurocognitive testing in April and was diagnosed with dementia, but daughter seemed to feel like this was not ever fully discussed with them.  She was just seen at the end of October, do you need sooner appt to discuss ongoing issues? Next scheduled appt with patient is 02/20/16.

## 2016-12-07 NOTE — Telephone Encounter (Signed)
Left message on machine for patient's daughter to call back.   

## 2016-12-07 NOTE — Telephone Encounter (Signed)
1.  We could repeat neurocognitive testing at a year if they would like.  April will be a year.  If so, put in referral to [facility] now since she is getting so booked 2.  Have they met with Janett Billow to discuss resources in community for care/support?   3.  I am unaware of any psychiatrists in [place] but maybe Janett Billow can research when she comes back on Wednesday?

## 2016-12-08 NOTE — Telephone Encounter (Signed)
Katherine Frye, I will let you talk to patient's daughter about resources. Let me know if I need to do anything further.

## 2016-12-09 ENCOUNTER — Telehealth: Payer: Self-pay | Admitting: Psychology

## 2016-12-09 NOTE — Telephone Encounter (Signed)
TC with daughter offered to meet with daughter/ patient. The daughter had a few barriers  to some of the resources that I shared (psychiatrist in [place]). I also reccommended Dr. Boyce Medici in [place].   The patient is seeing a psychologist and reports that is going well. She reports that the psychologist Dr. Irving Copas coordinates care with primary.   I placed information in the mail on getting help at home as they were interested in that.

## 2016-12-10 NOTE — Telephone Encounter (Signed)
Left message on machine for patient to call back.

## 2017-01-07 DIAGNOSIS — M1712 Unilateral primary osteoarthritis, left knee: Secondary | ICD-10-CM | POA: Diagnosis not present

## 2017-01-13 DIAGNOSIS — F331 Major depressive disorder, recurrent, moderate: Secondary | ICD-10-CM | POA: Diagnosis not present

## 2017-02-17 NOTE — Progress Notes (Signed)
Katherine Frye was seen today in the movement disorders clinic for neurologic consultation at the request of [name], MD.  The consultation is for the evaluation of Parkinson's disease.  I have reviewed prior records made available to me.  The patient has seen both Dr. [name] and Dr. [name].  Records indicate that her primary care physician is unsure that the patient has Parkinson's disease.  Patient started to see Dr. [name] in May, 2016 for memory loss and tremor.  Dr. [name] indicated that there was no evidence of Parkinson's on that examination.  Over the course of time with seeing Dr. [name], the patient had more complaints of memory loss which were felt to to depression but neurocognitive testing was recommended at [facility].  This was ultimately done in April, 2018.  This demonstrated "major neurocognitive disorder", which was defined as dementia possibly secondary to Parkinson's disease or Parkinson's plus syndrome with possible comorbid Alzheimer's disease.  It also noted depression and anxiety.  She was also worked up for the evaluation of seizure because of mental status change and EEGs were negative.  She transferred care to Dr. [name] in April, 2018 and he felt she had a parkinsonian syndrome and recommended a DaT scan.  DaT scan was done on April 28, 2016 at [facility] and was reported to show Abnormal image grade 2/3: Minimal uptake in the caudate heads bilaterally with no putamen uptake.  Dr. [name] started her on Azilect and neupro just a few days later.  She was started on carbidopa/levodopa 25/100 in 07/2016.  She ultimately d/c the neupro due to hallucinations and went up on carbidopa/levodopa 25/100 to qid.  She last saw Dr. [name] in 08/2016.  She has stopped carbidopa/levodopa right after seeing him because "nobody thought it was helpful at the time" but the tremor is much worse now that the medication is gone.  Pt thought that it also caused myalgia but she still has that.      Specific Symptoms:  Tremor: Yes.  , L arm and leg and R arm Family hx of similar:  No. Voice: no change Sleep: sleeping well  Vivid Dreams:  No.  Acting out dreams:  Yes.   , sleep talking only Wet Pillows: Yes.   Postural symptoms:  Yes.  , but minimal per family  Falls?  No. Bradykinesia symptoms: difficulty getting out of a chair Loss of smell:  No. Loss of taste:  Yes.   Urinary Incontinence:  No. Difficulty Swallowing:  No. Handwriting, micrographia: Yes.   Trouble with ADL's:  Yes.  , just slower than in the past and trouble getting arms in shirts  Trouble buttoning clothing: No. Depression:  No., but admits to frustration Memory changes:  Yes.  , lives with husband and his memory is good (he does finances/he does driving/daughter prepares pillbox x 1 year because of confusion and husband will then remind her to take med) Hallucinations:  Yes.  , with neupro patch  visual distortions: Yes.   N/V:  No. Lightheaded:  Yes.    Syncope: Yes.   but not for 1.5 years Diplopia:  No. but "weak eye on the right" for a year (blurry vision) Dyskinesia:  No.  Neuroimaging of the brain has previously been performed.  It was done in 2016 and demonstrated mild small vessel disease.  02/18/17 update:  Pt seen in follow up. This patient is accompanied in the office by her daughters who supplements the history.   Patient was  restarted on levodopa last visit, this time the CR version given memory change and the patient's believe that she had side effects with the immediate release (although I am not convinced that is the case).  Patients daughter called back not long after it was started and complained about diarrhea and mood change.  I reminded them that she has had a long history of diarrhea and has actually seen gastroenterology and been in the hospital for that.  Diarrhea is not a side effect of levodopa.  She was placed on Bentyl by gastroenterology and this has helped and she was told she  could take imodium prn.  She also complained about myalgia.  This was something that she had even off of levodopa last visit.  Depression was also another complaint.  We recommended psychiatry and she was given the contact information for behavioral health.  Taking carbidopa/levodopa CR, 1 in the AM but is woken up to take it at 7am (and then goes back to sleep until 11-12pm), one at 3pm and one at bedtime (8-10pm).  She has L arm and leg tremor.  She thinks that levodopa has helped stiffness/slowness but not the tremor.  No falls.  She does feel a tightness around her abdomen, usually lasting 24mn, somewhere between lunch and bedtime dosage.  No hallucinations.  She is not involved with community exercise.  [place]  Cramping at nighttime for the last 2 weeks  PREVIOUS MEDICATIONS: azilect, neupro (hallucinations), carbidopa/levodopa 25/100 IR  ALLERGIES:   Allergies  Allergen Reactions  . Levaquin [Levofloxacin In D5w] Nausea And Vomiting    Also dizziness and headaches    CURRENT MEDICATIONS:  Outpatient Encounter Medications as of 02/19/2017  Medication Sig  . BIOTIN PO Take by mouth daily.  . Calcium Carb-Cholecalciferol (CALCIUM-VITAMIN D3) 600-400 MG-UNIT CAPS Take 1 tablet by mouth daily. '1200mg'$  Ca and 1000units of vit D: For bone health  . Carbidopa-Levodopa ER (SINEMET CR) 25-100 MG tablet controlled release Take 1 tablet by mouth 3 (three) times daily.  . diclofenac sodium (VOLTAREN) 1 % GEL Apply 2 g topically 4 (four) times daily. For arthritis pain  . DULoxetine (CYMBALTA) 30 MG capsule Take 30 mg by mouth daily.  . DULoxetine (CYMBALTA) 60 MG capsule Take 1 capsule (60 mg total) by mouth at bedtime. For depression  . Multiple Vitamin (MULTIVITAMIN WITH MINERALS) TABS tablet Take 1 tablet by mouth at bedtime. For low Vitamin  . Multiple Vitamins-Minerals (ICAPS MV PO) Take by mouth daily.  . vitamin B-12  (CYANOCOBALAMIN) 1000 MCG tablet Take 1 tablet (1,000 mcg total) by mouth daily. For B-12 deficiency  . Vitamin D, Cholecalciferol, 1000 units CAPS Take 7,000 Units by mouth daily. For bone health  . [DISCONTINUED] naproxen (NAPROSYN) 500 MG tablet Take 500 mg by mouth 2 (two) times daily with a meal.  . [DISCONTINUED] venlafaxine (EFFEXOR) 37.5 MG tablet Take 37.5 mg by mouth at bedtime. Reported on 02/18/2015   No facility-administered encounter medications on file as of 02/19/2017.     PAST MEDICAL HISTORY:   Past Medical History:  Diagnosis Date  . Allergy 2010   medication and seasonal  . [place]   [place] breast  . Malignant neoplasm of upper-inner quadrant of female breast [place] 2010  . Obesity, unspecified 2013  . Solitary cyst of breast 2012   left  . Special screening for malignant neoplasms, colon 2013    PAST SURGICAL HISTORY:  Past Surgical History:  Procedure Laterality Date  . APPENDECTOMY    . BREAST BIOPSY Right 2010  . BREAST CYST EXCISION Right 2006   fibroid cyst  . BREAST SURGERY Right 2010   T2, N0, ER/PR positive, HER-2/neu negative, Oncotype recurrence score  of 10 ( 7% risk of recurrence)   . COLONOSCOPY  2010   Dr. Lyndel Safe, [place]  . FOOT SURGERY  1996  . KNEE ARTHROSCOPY Right 2012   May and October  . KNEE SURGERY Right 2013   replacement  . NASAL SINUS SURGERY  1991  . SKIN CANCER EXCISION  2013   basel cell on forehead  . TONSILLECTOMY    . TUBAL LIGATION    . UPPER GI ENDOSCOPY  2010    SOCIAL HISTORY:   Social History   Socioeconomic History  . Marital status: Married    Spouse name: Sherl Yzaguirre "Vic"  . Number of children: 2  . Years of education: 81  . Highest education level: Not on file  Social Needs  . Financial resource strain: Not on file  . Food insecurity - worry: Not on file  . Food insecurity - inability: Not on file  . Transportation needs - medical: Not on file  . Transportation needs - non-medical: Not  on file  Occupational History  . Not on file  Tobacco Use  . Smoking status: Never Smoker  . Smokeless tobacco: Never Used  Substance and Sexual Activity  . Alcohol use: No  . Drug use: No  . Sexual activity: Not on file  Other Topics Concern  . Not on file  Social History Narrative   Lives at home with husband.   Caffeine use: Drinks 1 cup coffee per day (sometimes 2 cups per day)    FAMILY HISTORY:   Family Status  Relation Name Status  . Sister x6 Alive  . Mother  Deceased  . Father  Deceased  . Brother x3 Alive  . Sister  Deceased  . Daughter x2 Alive    ROS:  A complete 10 system review of systems was obtained and was unremarkable apart from what is mentioned above.  PHYSICAL EXAMINATION:    VITALS:   Vitals:   02/19/17 1311  BP: 126/74  Pulse: 64  SpO2: 94%  Weight: 199 lb (90.3 kg)  Height: '5\' 10"'$  (1.778 m)    GEN:  The patient appears stated age and is in NAD. HEENT:  Normocephalic, atraumatic.  The mucous membranes are moist. The superficial temporal arteries are without ropiness or tenderness. CV:  RRR Lungs:  CTAB Neck/HEME:  There are no carotid bruits bilaterally.  Neurological examination:  Orientation:  [place] Cognitive Assessment  10/29/2016 06/05/2014  Visuospatial/ Executive (0/5) 2 3  Naming (0/3) 2 2  Attention: Read list of digits (0/2) 0 2  Attention: Read list of letters (0/1) 1 1  Attention: Serial 7 subtraction starting at 100 (0/3) 1 2  Language: Repeat phrase (0/2) 0 2  Language : Fluency (0/1) 1 1  Abstraction (0/2) 1 2  Delayed Recall (0/5) 2 4  Orientation (0/6) 5 6  Total 15 25  Adjusted Score (based on education) 16 26    Cranial nerves: There is good facial symmetry. There is facial hypomimia.   The visual fields are full to confrontational testing. The speech is fluent and clear. Soft palate rises symmetrically and there is no tongue deviation. Hearing is intact to conversational tone. Sensation: Sensation is intact  to light and pinprick  throughout (facial, trunk, extremities). Vibration is intact at the bilateral big toe. There is no extinction with double simultaneous stimulation. There is no sensory dermatomal level identified. Motor: Strength is at least antigravity x 4   Movement examination: Tone: There is normal tone in the bilateral upper extremities.  The tone in the lower extremities is normal.  Abnormal movements: There is tremor in the LUE/LLE Coordination:  There is mild decremation on the L Gait and Station: The patient has  difficulty arising out of a deep-seated chair without the use of the hands and falls back after the first attempt.  She then pushes off of the chair.  Her stride length is actually quite good and she walks quickly down the hall.    ASSESSMENT/PLAN:  1.  Parkinsonism.  I suspect that this does represent idiopathic Parkinson's disease but Lewy body dementia cannot be ruled out.  I think that is lower on the list given her Moca was actually pretty good several years ago when she presented with symptoms.    -We discussed the diagnosis as well as pathophysiology of the disease.  We discussed treatment options as well as prognostic indicators.  Patient education was provided.  -We discussed that it used to be thought that levodopa would increase risk of melanoma but now it is believed that Parkinsons itself likely increases risk of melanoma. she is to get regular skin checks.  -She will increase carbidopa/levodopa 25/100 CR at 7am/11am/4pm and one at bedtime.  May adjust if wakes up later but don't wake up to take medication and then go back to sleep for 3-4 hours.  Risks, benefits, side effects and alternative therapies were discussed.  The opportunity to ask questions was given and they were answered to the best of my ability.  The patient expressed understanding and willingness to follow the outlined treatment protocols.  Discussed interaction with protein with levodopa.  Discussed  alarmed pill box.  Met with our social worker again today  -agree with trying to get her in [facility] [place].  2.  Parkinson's related dementia  -Patient has had neurocognitive testing at [facility] in April, 2018.  This demonstrated evidence of dementia.  Reiterated importance of regular daily schedule and having routine.  Gave examples of what a day may look like.  -talked about aricept, which family would like to start. Will start 5 mg daily.   Risks, benefits, side effects and alternative therapies were discussed.  The opportunity to ask questions was given and they were answered to the best of my ability.  The patient expressed understanding and willingness to follow the outlined treatment protocols.  3.  Follow up is anticipated in the next few months, sooner should new neurologic issues arise.  Much greater than 50% of this visit was spent in counseling and coordinating care.  Total face to face time:  35 min   Cc:  [name], MD

## 2017-02-19 ENCOUNTER — Encounter: Payer: Self-pay | Admitting: Psychology

## 2017-02-19 ENCOUNTER — Encounter: Payer: Self-pay | Admitting: Neurology

## 2017-02-19 ENCOUNTER — Ambulatory Visit: Payer: Medicare HMO | Admitting: Neurology

## 2017-02-19 VITALS — BP 126/74 | HR 64 | Ht 70.0 in | Wt 199.0 lb

## 2017-02-19 DIAGNOSIS — F0280 Dementia in other diseases classified elsewhere without behavioral disturbance: Secondary | ICD-10-CM

## 2017-02-19 DIAGNOSIS — G20 Parkinson's disease: Secondary | ICD-10-CM | POA: Diagnosis not present

## 2017-02-19 MED ORDER — CARBIDOPA-LEVODOPA ER 25-100 MG PO TBCR: 1.0000 | EXTENDED_RELEASE_TABLET | Freq: Four times a day (QID) | ORAL | 4 refills | Status: DC

## 2017-02-19 MED ORDER — DONEPEZIL HCL 5 MG PO TABS: 5.0000 mg | ORAL_TABLET | Freq: Every day | ORAL | 4 refills | Status: DC

## 2017-02-19 NOTE — Progress Notes (Signed)
I met with the patient and her 2 daughters today while they were in the clinic.  The patient's daughters report that their father the patient's husband is home today.  We talked some about medication compliance and strategies around taking medications for Parkinson's since these medications are several times a day and very times specific.  I showed the 2 daughters the reminder pillbox.  One daughter shared that her father would probably break it accidentally so she did not think it would be a good fit for the patient.  I talked a little bit about finding a proactive solution that will work for the patient and her family. They have my contact information and are more than welcome to give me a call at any point. 

## 2017-02-19 NOTE — Patient Instructions (Signed)
1. Increase Carbidopa Levodopa 25/100 CR to 1 tablet at 7 am, 1 tablet at 11 am, 1 tablet at 4 pm and 1 tablet at bedtime  2. Start Aricept 5 mg one tablet daily.

## 2017-03-01 ENCOUNTER — Telehealth: Payer: Self-pay | Admitting: Neurology

## 2017-03-01 NOTE — Telephone Encounter (Signed)
Form signed for patient to go to [facility].  LMOM with Vicky to let her know I could fax form but can not use email. Awaiting call back with fax number.

## 2017-04-02 DIAGNOSIS — Z6830 Body mass index (BMI) 30.0-30.9, adult: Secondary | ICD-10-CM | POA: Diagnosis not present

## 2017-04-02 DIAGNOSIS — H6123 Impacted cerumen, bilateral: Secondary | ICD-10-CM | POA: Diagnosis not present

## 2017-04-02 DIAGNOSIS — G20 Parkinson's disease: Secondary | ICD-10-CM | POA: Diagnosis not present

## 2017-04-02 DIAGNOSIS — G3183 Dementia with Lewy bodies: Secondary | ICD-10-CM | POA: Diagnosis not present

## 2017-05-13 ENCOUNTER — Telehealth: Payer: Self-pay | Admitting: Neurology

## 2017-05-13 NOTE — Telephone Encounter (Signed)
Patient's daughter made aware. She was clearly unhappy with the answer.

## 2017-05-13 NOTE — Telephone Encounter (Signed)
Patient daughter called and wants to talk to someone about her mother. Her mother states that she is in a lot of pain with her back. She is having some burning in the back and in the back of the arms as well. She would like to know if there is something they need to do differently to help the patient until she comes in to see Dr Tat

## 2017-05-13 NOTE — Telephone Encounter (Signed)
Spoke with patient's daughter. She is aware Dr. Carles Collet is a movement specialist and doesn't treat back pain. I advised for patient to see PCP to evaluate what she needs. She has seen multiple providers for back pain over the years. She wants Dr. Doristine Devoid opinion on which provider she should see to treat this particular problems. I advised pain management, but she wants Dr. Doristine Devoid opinion on who and a provider's name.   Please advise.

## 2017-05-13 NOTE — Telephone Encounter (Signed)
I will have to defer to PCP or whoever treats that, as for a referral, I would need to document back pain and her hx with it and I don't know any of that.

## 2017-05-31 ENCOUNTER — Other Ambulatory Visit: Payer: Self-pay | Admitting: Gastroenterology

## 2017-06-04 ENCOUNTER — Telehealth: Payer: Self-pay | Admitting: Gastroenterology

## 2017-06-04 NOTE — Telephone Encounter (Signed)
Do you want to give her this? I looked in [place] and it doesn't look like its on her current medication list. In your note from 11/2016 it says you would like to avoid Bentyl.

## 2017-06-04 NOTE — Telephone Encounter (Signed)
Pt's daughter Olegario Shearer calling requesting rf for bentyl sent to [facility] in [place].

## 2017-06-06 NOTE — Telephone Encounter (Signed)
couldnot log on to [facility] I do remember she was taking it bid prn. Lets call in bentyl 10mg  po bid prn #60, 1 refill Also, pl confirm it with daughter.

## 2017-06-07 MED ORDER — DICYCLOMINE HCL 10 MG PO CAPS: 10.0000 mg | ORAL_CAPSULE | Freq: Two times a day (BID) | ORAL | 1 refills | Status: DC

## 2017-06-07 NOTE — Telephone Encounter (Signed)
Sent to patient's pharmacy.

## 2017-06-23 NOTE — Progress Notes (Signed)
Katherine Frye was seen today in the movement disorders clinic for neurologic consultation at the request of [name], MD.  The consultation is for the evaluation of Parkinson's disease.  I have reviewed prior records made available to me.  The patient has seen both Dr. [name] and Dr. [name].  Records indicate that her primary care physician is unsure that the patient has Parkinson's disease.  Patient started to see Dr. [name] in May, 2016 for memory loss and tremor.  Dr. [name] indicated that there was no evidence of Parkinson's on that examination.  Over the course of time with seeing Dr. [name], the patient had more complaints of memory loss which were felt to to depression but neurocognitive testing was recommended at [facility].  This was ultimately done in April, 2018.  This demonstrated "major neurocognitive disorder", which was defined as dementia possibly secondary to Parkinson's disease or Parkinson's plus syndrome with possible comorbid Alzheimer's disease.  It also noted depression and anxiety.  She was also worked up for the evaluation of seizure because of mental status change and EEGs were negative.  She transferred care to Dr. [name] in April, 2018 and he felt she had a parkinsonian syndrome and recommended a DaT scan.  DaT scan was done on April 28, 2016 at [facility] and was reported to show Abnormal image grade 2/3: Minimal uptake in the caudate heads bilaterally with no putamen uptake.  Dr. [name] started her on Azilect and neupro just a few days later.  She was started on carbidopa/levodopa 25/100 in 07/2016.  She ultimately d/c the neupro due to hallucinations and went up on carbidopa/levodopa 25/100 to qid.  She last saw Dr. [name] in 08/2016.  She has stopped carbidopa/levodopa right after seeing him because "nobody thought it was helpful at the time" but the tremor is much worse now that the medication is gone.  Pt thought that it also caused myalgia but she still has that.      Specific Symptoms:  Tremor: Yes.  , L arm and leg and R arm Family hx of similar:  No. Voice: no change Sleep: sleeping well  Vivid Dreams:  No.  Acting out dreams:  Yes.   , sleep talking only Wet Pillows: Yes.   Postural symptoms:  Yes.  , but minimal per family  Falls?  No. Bradykinesia symptoms: difficulty getting out of a chair Loss of smell:  No. Loss of taste:  Yes.   Urinary Incontinence:  No. Difficulty Swallowing:  No. Handwriting, micrographia: Yes.   Trouble with ADL's:  Yes.  , just slower than in the past and trouble getting arms in shirts  Trouble buttoning clothing: No. Depression:  No., but admits to frustration Memory changes:  Yes.  , lives with husband and his memory is good (he does finances/he does driving/daughter prepares pillbox x 1 year because of confusion and husband will then remind her to take med) Hallucinations:  Yes.  , with neupro patch  visual distortions: Yes.   N/V:  No. Lightheaded:  Yes.    Syncope: Yes.   but not for 1.5 years Diplopia:  No. but "weak eye on the right" for a year (blurry vision) Dyskinesia:  No.  Neuroimaging of the brain has previously been performed.  It was done in 2016 and demonstrated mild small vessel disease.  02/18/17 update:  Pt seen in follow up. This patient is accompanied in the office by her daughters who supplements the history.   Patient was  restarted on levodopa last visit, this time the CR version given memory change and the patient's believe that she had side effects with the immediate release (although I am not convinced that is the case).  Patients daughter called back not long after it was started and complained about diarrhea and mood change.  I reminded them that she has had a long history of diarrhea and has actually seen gastroenterology and been in the hospital for that.  Diarrhea is not a side effect of levodopa.  She was placed on Bentyl by gastroenterology and this has helped and she was told she  could take imodium prn.  She also complained about myalgia.  This was something that she had even off of levodopa last visit.  Depression was also another complaint.  We recommended psychiatry and she was given the contact information for behavioral health.  Taking carbidopa/levodopa CR, 1 in the AM but is woken up to take it at 7am (and then goes back to sleep until 11-12pm), one at 3pm and one at bedtime (8-10pm).  She has L arm and leg tremor.  She thinks that levodopa has helped stiffness/slowness but not the tremor.  No falls.  She does feel a tightness around her abdomen, usually lasting 50mn, somewhere between lunch and bedtime dosage.  No hallucinations.  She is not involved with community exercise.  [place].  Cramping at nighttime for the last 2 weeks  06/24/17 update: Patient is seen today in follow-up for Parkinson's.  She is accompanied by her daughter to supplement the history.  She is on carbidopa/levodopa 25/100 CR, 1 tablet 4 times per day, the last being at bedtime.  When she first wakes up she doesn't feel well and it seems to take 2 hours for first morning pills.  Pt feels that she got a new manufacturer just now and doesn't think that she feels the same.  "I have a little pain and my back has been hurting."  Patient's family called last visit because of chronic back pain.  Explained that this is not really something that I treat.  She has previously seen Dr. NRita Ohara  MRI lumbar spine done in 2017 and I reviewed that and while there was some degenerative changes, it was fairly unremarkable.   Told her that she really would need to follow back up with her primary care physician, as I did not know her history regarding back pain and who she had seen.  They request referral today to pain management. Started aricept last visit.  Tolerating that medication well.  Having some headaches but overall mild.    quit going to boxing.  "the man  who runs it is rude."  She is not seeing psychiatry or counseling any longer.  Mood has been an issue.  PREVIOUS MEDICATIONS: azilect, neupro (hallucinations), carbidopa/levodopa 25/100 IR  ALLERGIES:   Allergies  Allergen Reactions  . Levaquin [Levofloxacin In D5w] Nausea And Vomiting    Also dizziness and headaches    CURRENT MEDICATIONS:  Outpatient Encounter Medications as of 06/24/2017  Medication Sig  . BIOTIN PO Take by mouth daily.  . Calcium Carb-Cholecalciferol (CALCIUM-VITAMIN D3) 600-400 MG-UNIT CAPS Take 1 tablet by mouth daily. 120108mCa and 1000units of vit D: For bone health  . Carbidopa-Levodopa ER (SINEMET CR) 25-100 MG tablet controlled release Take 1 tablet by mouth 3 (three) times daily.  . diclofenac sodium (VOLTAREN) 1 % GEL Apply 2 g  topically 4 (four) times daily. For arthritis pain  . dicyclomine (BENTYL) 10 MG capsule Take 1 capsule (10 mg total) by mouth 2 (two) times daily.  Marland Kitchen donepezil (ARICEPT) 5 MG tablet Take 1 tablet (5 mg total) by mouth daily.  . DULoxetine (CYMBALTA) 30 MG capsule Take 30 mg by mouth daily.  . DULoxetine (CYMBALTA) 60 MG capsule Take 1 capsule (60 mg total) by mouth at bedtime. For depression  . ibuprofen (ADVIL,MOTRIN) 200 MG tablet Take 200 mg by mouth 2 (two) times daily.  . Multiple Vitamin (MULTIVITAMIN WITH MINERALS) TABS tablet Take 1 tablet by mouth at bedtime. For low Vitamin  . Multiple Vitamins-Minerals (ICAPS MV PO) Take by mouth daily.  . vitamin B-12 (CYANOCOBALAMIN) 1000 MCG tablet Take 1 tablet (1,000 mcg total) by mouth daily. For B-12 deficiency  . Vitamin D, Cholecalciferol, 1000 units CAPS Take 7,000 Units by mouth daily. For bone health  . [DISCONTINUED] Carbidopa-Levodopa ER (SINEMET CR) 25-100 MG tablet controlled release Take 1 tablet by mouth 4 (four) times daily.  . [DISCONTINUED] donepezil (ARICEPT) 5 MG tablet Take 1 tablet (5 mg total) by mouth daily.  . carbidopa-levodopa (SINEMET CR) 50-200 MG tablet Take  1 tablet by mouth at bedtime.  . [DISCONTINUED] venlafaxine (EFFEXOR) 37.5 MG tablet Take 37.5 mg by mouth at bedtime. Reported on 02/18/2015   No facility-administered encounter medications on file as of 06/24/2017.     PAST MEDICAL HISTORY:   Past Medical History:  Diagnosis Date  . Allergy 2010   medication and seasonal  . Cancer ([place]) 2010   right breast  . Malignant neoplasm of upper-inner quadrant of female breast ([place]) 2010  . Obesity, unspecified 2013  . Solitary cyst of breast 2012   left  . Special screening for malignant neoplasms, colon 2013    PAST SURGICAL HISTORY:   Past Surgical History:  Procedure Laterality Date  . APPENDECTOMY    . BREAST BIOPSY Right 2010  . BREAST CYST EXCISION Right 2006   fibroid cyst  . BREAST SURGERY Right 2010   T2, N0, ER/PR positive, HER-2/neu negative, Oncotype recurrence score  of 10 ( 7% risk of recurrence)   . COLONOSCOPY  2010   [facility]  . FOOT SURGERY  1996  . KNEE ARTHROSCOPY Right 2012   May and October  . KNEE SURGERY Right 2013   replacement  . NASAL SINUS SURGERY  1991  . SKIN CANCER EXCISION  2013   basel cell on forehead  . TONSILLECTOMY    . TUBAL LIGATION    . UPPER GI ENDOSCOPY  2010    SOCIAL HISTORY:   Social History   Socioeconomic History  . Marital status: Married    Spouse name: Alexsys Eskin "Vic"  . Number of children: 2  . Years of education: 21  . Highest education level: Not on file  Occupational History  . Not on file  Social Needs  . Financial resource strain: Not on file  . Food insecurity:    Worry: Not on file    Inability: Not on file  . Transportation needs:    Medical: Not on file    Non-medical: Not on file  Tobacco Use  . Smoking status: Never Smoker  . Smokeless tobacco: Never Used  Substance and Sexual Activity  . Alcohol use: No  . Drug use: No  . Sexual activity: Not on file  Lifestyle  . Physical activity:    Days per week: Not on file  Minutes  per session: Not on file  . Stress: Not on file  Relationships  . Social connections:    Talks on phone: Not on file    Gets together: Not on file    Attends religious service: Not on file    Active member of club or organization: Not on file    Attends meetings of clubs or organizations: Not on file    Relationship status: Not on file  . Intimate partner violence:    Fear of current or ex partner: Not on file    Emotionally abused: Not on file    Physically abused: Not on file    Forced sexual activity: Not on file  Other Topics Concern  . Not on file  Social History Narrative   Lives at home with husband.   Caffeine use: Drinks 1 cup coffee per day (sometimes 2 cups per day)    FAMILY HISTORY:   Family Status  Relation Name Status  . Sister x6 Alive  . Mother  Deceased  . Father  Deceased  . Brother x3 Alive  . Sister  Deceased  . Daughter x2 Alive    ROS:  Review of Systems  Constitutional: Positive for malaise/fatigue.  HENT: Negative.   Eyes: Negative.   Cardiovascular: Negative.   Gastrointestinal: Negative.   Genitourinary: Negative.   Musculoskeletal: Positive for back pain.  Skin: Negative.   Neurological: Positive for tremors.  Endo/Heme/Allergies: Negative.   Psychiatric/Behavioral: The patient is nervous/anxious.      PHYSICAL EXAMINATION:    VITALS:   Vitals:   06/24/17 1544  BP: 102/70  Pulse: 70  SpO2: 98%  Weight: 200 lb (90.7 kg)  Height: 5' 10.5" (1.791 m)    GEN:  The patient appears stated age and is in NAD. HEENT:  Normocephalic, atraumatic.  The mucous membranes are moist. The superficial temporal arteries are without ropiness or tenderness. CV:  rrr Lungs:  CTAB Neck/HEME:  There are no carotid bruits bilaterally.  Neurological examination:  Orientation: alert and oriented x 3 Montreal Cognitive Assessment  10/29/2016 06/05/2014  Visuospatial/ Executive (0/5) 2 3  Naming (0/3) 2 2  Attention: Read list of digits (0/2) 0 2    Attention: Read list of letters (0/1) 1 1  Attention: Serial 7 subtraction starting at 100 (0/3) 1 2  Language: Repeat phrase (0/2) 0 2  Language : Fluency (0/1) 1 1  Abstraction (0/2) 1 2  Delayed Recall (0/5) 2 4  Orientation (0/6) 5 6  Total 15 25  Adjusted Score (based on education) 16 26      Neurological examination:   Cranial nerves: There is good facial symmetry. The speech is fluent and clear. Soft palate rises symmetrically and there is no tongue deviation. Hearing is intact to conversational tone. Sensation: Sensation is intact to light touch throughout Motor: Strength is 5/5 in the bilateral upper and lower extremities.   Shoulder shrug is equal and symmetric.  There is no pronator drift.  Movement examination: Tone: There is normal tone in the RUE.  There is normal tone in the LUE.  There is normal tone in the RLE.  There is normal tone in the LLE.  Abnormal movements: There is very mild tremor in the left upper and lower extremity. Coordination:  There is mild decremation with RAM's, seen mostly on the left. Gait and Station: The patient has no difficulty arising out of a deep-seated chair without the use of the hands. The patient's stride length is  normal.  She walks with good arm swing.  She is very stable.   ASSESSMENT/PLAN:  1.  Probable idiopathic Parkinson's disease, but Lewy body disease cannot be ruled out.  I think this is much less likely however.   -We discussed the diagnosis as well as pathophysiology of the disease.  We discussed treatment options as well as prognostic indicators.  Patient education was provided.  -We discussed that it used to be thought that levodopa would increase risk of melanoma but now it is believed that Parkinsons itself likely increases risk of melanoma. she is to get regular skin checks.  -She will continue current of/levodopa 25/100 CR at 7 AM/11 AM/4 PM and will increase the bedtime to carbidopa/levodopa 50/200 CR due to trouble  with morning "on."    -I had a very long discussion with the patient today.  Discussed that I thought she puts up with many barriers to her own health care.  She has many excuses about why she is not going to counseling, psychiatry, rock steady boxing and I told her that she needed to add the necessary team members back to her team in order to help her.  I explained to her that I cannot help her if she does not help herself.  -refer to [facility] for physical therapy.  Talked extensively about getting back into exercise.  2.  Parkinson's related dementia  -Patient has had neurocognitive testing at [facility] in April, 2018.  This demonstrated evidence of dementia.  Reiterated importance of regular daily schedule and having routine.  Gave examples of what a day may look like.  -asks me again about driving and I told her she should not be driving.  still doing some driving despite me telling her not to.  Discussed details of this.    -Continue Aricept, 5 mg daily.  Refills were sent.  3.  low/mid back pain  -pt saw Dr. Rita Ohara in 2017.  Had MRI lumbar spine that was unrevealing and I reviewed this.  Still having the pain and will refer to Dr. Letta Pate to see if he can help pt.  Do not think that this is associated with PD.    4.  Depression  -given information for counselors.  I still think that this is the biggest issue for her.  Primary care is managing her medications because she quit going to psychiatry.  Told her that she needed to follow back up with primary care.  5.  I will plan on seeing her back in 5 months.  I spent 60 minutes in the room with the patient today with much greater than 50% in counseling, as above.   Cc:  [name], MD

## 2017-06-24 ENCOUNTER — Encounter: Payer: Self-pay | Admitting: Neurology

## 2017-06-24 ENCOUNTER — Ambulatory Visit: Payer: Medicare HMO | Admitting: Neurology

## 2017-06-24 ENCOUNTER — Telehealth: Payer: Self-pay | Admitting: Neurology

## 2017-06-24 VITALS — BP 102/70 | HR 70 | Ht 70.5 in | Wt 200.0 lb

## 2017-06-24 DIAGNOSIS — G20 Parkinson's disease: Secondary | ICD-10-CM | POA: Diagnosis not present

## 2017-06-24 DIAGNOSIS — M545 Low back pain: Secondary | ICD-10-CM

## 2017-06-24 DIAGNOSIS — F331 Major depressive disorder, recurrent, moderate: Secondary | ICD-10-CM

## 2017-06-24 MED ORDER — CARBIDOPA-LEVODOPA ER 50-200 MG PO TBCR: 1.0000 | EXTENDED_RELEASE_TABLET | Freq: Every day | ORAL | 5 refills | Status: DC

## 2017-06-24 MED ORDER — DONEPEZIL HCL 5 MG PO TABS: 5.0000 mg | ORAL_TABLET | Freq: Every day | ORAL | 5 refills | Status: DC

## 2017-06-24 MED ORDER — CARBIDOPA-LEVODOPA ER 25-100 MG PO TBCR: 1.0000 | EXTENDED_RELEASE_TABLET | Freq: Three times a day (TID) | ORAL | 5 refills | Status: DC

## 2017-06-24 NOTE — Patient Instructions (Addendum)
1. Continue Carbidopa Levodopa 25/100 CR - 1 tablet three times daily 2. Start Carbidopa Levodopa 50/200 CR - 1 tablet at bedtime.  3. We have sent a referral to Dr. Ella Bodo. If you do not hear from them please call them at [phone]. 4. Referral will be sent to [facility]. They will call with an appointment. If you do not hear from them please call [phone].  Registration is OPEN!    Third Annual Parkinson's Education Symposium   To register: [email or web address]      Search:  [employer] person attending individually Questions: [place], [place] or [email or web address] [email or web address]

## 2017-06-24 NOTE — Telephone Encounter (Signed)
Patient's daughter wanted to make sure that [phone] was contacted first for the Referrals. Thanks

## 2017-06-25 ENCOUNTER — Telehealth: Payer: Self-pay | Admitting: Neurology

## 2017-06-25 NOTE — Telephone Encounter (Signed)
Referral faxed to [facility] with confirmation received.

## 2017-06-25 NOTE — Telephone Encounter (Signed)
I did make daughter aware that they would contact the number that was marked as the main contact. This number is marked as main contact.

## 2017-06-28 ENCOUNTER — Telehealth: Payer: Self-pay | Admitting: Psychology

## 2017-06-28 NOTE — Telephone Encounter (Signed)
Telephone call with daughter Jocelyn Lamer with the intention of following up on resources for counseling services in the [place] area.  This daughter reported that she was out of town during this visit with Dr. Carles Collet.   Apparently the patient has seen a psychologist "Dr.G" and [place].  This counselor has had health issues and has not been as available lately.  I recommended that the family contact the office to find out if there are any recommendations for counselors from that office since they are familiar with the area and it would not be uncommon for an office to have referrals to other offices if the therapist was away for an extended period of time.  In addition, I will send her some resources for [facility] and will send what I can find for [facility].  They reported some success in counseling with this Dr. Darnell Level in [place].  The daughter reported that they went to a psychiatrist in the past and were unhappy with his care.  She is most concerned feeling like her mother's medications need to be tweaked more than psychotherapy needs to be provided.  I shared that the recommendation was for psychotherapy so I wanted to make sure that she got the appropriate resources. Since the patient liked,  I will send resources to Agustina Caroli at [place], [place], [place] [address].

## 2017-07-02 DIAGNOSIS — M545 Low back pain: Secondary | ICD-10-CM | POA: Diagnosis not present

## 2017-07-02 DIAGNOSIS — G20 Parkinson's disease: Secondary | ICD-10-CM | POA: Diagnosis not present

## 2017-07-02 DIAGNOSIS — R2689 Other abnormalities of gait and mobility: Secondary | ICD-10-CM | POA: Diagnosis not present

## 2017-07-02 DIAGNOSIS — M6281 Muscle weakness (generalized): Secondary | ICD-10-CM | POA: Diagnosis not present

## 2017-07-05 DIAGNOSIS — M545 Low back pain: Secondary | ICD-10-CM | POA: Diagnosis not present

## 2017-07-05 DIAGNOSIS — M6281 Muscle weakness (generalized): Secondary | ICD-10-CM | POA: Diagnosis not present

## 2017-07-05 DIAGNOSIS — R2689 Other abnormalities of gait and mobility: Secondary | ICD-10-CM | POA: Diagnosis not present

## 2017-07-05 DIAGNOSIS — G20 Parkinson's disease: Secondary | ICD-10-CM | POA: Diagnosis not present

## 2017-07-07 DIAGNOSIS — M545 Low back pain: Secondary | ICD-10-CM | POA: Diagnosis not present

## 2017-07-07 DIAGNOSIS — M6281 Muscle weakness (generalized): Secondary | ICD-10-CM | POA: Diagnosis not present

## 2017-07-07 DIAGNOSIS — R2689 Other abnormalities of gait and mobility: Secondary | ICD-10-CM | POA: Diagnosis not present

## 2017-07-07 DIAGNOSIS — G20 Parkinson's disease: Secondary | ICD-10-CM | POA: Diagnosis not present

## 2017-07-12 DIAGNOSIS — M6281 Muscle weakness (generalized): Secondary | ICD-10-CM | POA: Diagnosis not present

## 2017-07-12 DIAGNOSIS — R2689 Other abnormalities of gait and mobility: Secondary | ICD-10-CM | POA: Diagnosis not present

## 2017-07-12 DIAGNOSIS — G20 Parkinson's disease: Secondary | ICD-10-CM | POA: Diagnosis not present

## 2017-07-12 DIAGNOSIS — M545 Low back pain: Secondary | ICD-10-CM | POA: Diagnosis not present

## 2017-07-14 DIAGNOSIS — M545 Low back pain: Secondary | ICD-10-CM | POA: Diagnosis not present

## 2017-07-14 DIAGNOSIS — R2689 Other abnormalities of gait and mobility: Secondary | ICD-10-CM | POA: Diagnosis not present

## 2017-07-14 DIAGNOSIS — M6281 Muscle weakness (generalized): Secondary | ICD-10-CM | POA: Diagnosis not present

## 2017-07-14 DIAGNOSIS — G20 Parkinson's disease: Secondary | ICD-10-CM | POA: Diagnosis not present

## 2017-07-19 DIAGNOSIS — G20 Parkinson's disease: Secondary | ICD-10-CM | POA: Diagnosis not present

## 2017-07-19 DIAGNOSIS — M545 Low back pain: Secondary | ICD-10-CM | POA: Diagnosis not present

## 2017-07-19 DIAGNOSIS — R2689 Other abnormalities of gait and mobility: Secondary | ICD-10-CM | POA: Diagnosis not present

## 2017-07-19 DIAGNOSIS — M6281 Muscle weakness (generalized): Secondary | ICD-10-CM | POA: Diagnosis not present

## 2017-07-22 DIAGNOSIS — R2689 Other abnormalities of gait and mobility: Secondary | ICD-10-CM | POA: Diagnosis not present

## 2017-07-22 DIAGNOSIS — G20 Parkinson's disease: Secondary | ICD-10-CM | POA: Diagnosis not present

## 2017-07-22 DIAGNOSIS — M545 Low back pain: Secondary | ICD-10-CM | POA: Diagnosis not present

## 2017-07-22 DIAGNOSIS — M6281 Muscle weakness (generalized): Secondary | ICD-10-CM | POA: Diagnosis not present

## 2017-07-27 ENCOUNTER — Ambulatory Visit: Payer: Medicare HMO | Admitting: Physical Medicine & Rehabilitation

## 2017-07-27 ENCOUNTER — Ambulatory Visit
Admission: RE | Admit: 2017-07-27 | Discharge: 2017-07-27 | Disposition: A | Discharge status: Home / Self Care | Payer: Medicare HMO | Source: Ambulatory Visit | Attending: Physical Medicine & Rehabilitation | Admitting: Physical Medicine & Rehabilitation

## 2017-07-27 ENCOUNTER — Encounter: Payer: Self-pay | Admitting: Physical Medicine & Rehabilitation

## 2017-07-27 ENCOUNTER — Encounter: Payer: Medicare HMO | Attending: Physical Medicine & Rehabilitation

## 2017-07-27 VITALS — BP 127/82 | HR 57 | Ht 70.0 in | Wt 199.0 lb

## 2017-07-27 DIAGNOSIS — G8929 Other chronic pain: Secondary | ICD-10-CM

## 2017-07-27 DIAGNOSIS — Z791 Long term (current) use of non-steroidal anti-inflammatories (NSAID): Secondary | ICD-10-CM | POA: Insufficient documentation

## 2017-07-27 DIAGNOSIS — Z853 Personal history of malignant neoplasm of breast: Secondary | ICD-10-CM | POA: Diagnosis not present

## 2017-07-27 DIAGNOSIS — F329 Major depressive disorder, single episode, unspecified: Secondary | ICD-10-CM | POA: Insufficient documentation

## 2017-07-27 DIAGNOSIS — F0280 Dementia in other diseases classified elsewhere without behavioral disturbance: Secondary | ICD-10-CM | POA: Insufficient documentation

## 2017-07-27 DIAGNOSIS — M545 Low back pain, unspecified: Secondary | ICD-10-CM

## 2017-07-27 DIAGNOSIS — E669 Obesity, unspecified: Secondary | ICD-10-CM | POA: Insufficient documentation

## 2017-07-27 DIAGNOSIS — Z8249 Family history of ischemic heart disease and other diseases of the circulatory system: Secondary | ICD-10-CM | POA: Insufficient documentation

## 2017-07-27 DIAGNOSIS — Z807 Family history of other malignant neoplasms of lymphoid, hematopoietic and related tissues: Secondary | ICD-10-CM | POA: Insufficient documentation

## 2017-07-27 DIAGNOSIS — M47816 Spondylosis without myelopathy or radiculopathy, lumbar region: Secondary | ICD-10-CM | POA: Diagnosis not present

## 2017-07-27 DIAGNOSIS — M24551 Contracture, right hip: Secondary | ICD-10-CM | POA: Insufficient documentation

## 2017-07-27 DIAGNOSIS — Z79899 Other long term (current) drug therapy: Secondary | ICD-10-CM | POA: Diagnosis not present

## 2017-07-27 DIAGNOSIS — G20 Parkinson's disease: Secondary | ICD-10-CM | POA: Diagnosis not present

## 2017-07-27 DIAGNOSIS — Z6828 Body mass index (BMI) 28.0-28.9, adult: Secondary | ICD-10-CM | POA: Insufficient documentation

## 2017-07-27 DIAGNOSIS — Z808 Family history of malignant neoplasm of other organs or systems: Secondary | ICD-10-CM | POA: Insufficient documentation

## 2017-07-27 DIAGNOSIS — Z85828 Personal history of other malignant neoplasm of skin: Secondary | ICD-10-CM | POA: Diagnosis not present

## 2017-07-27 NOTE — Progress Notes (Signed)
Subjective:    Patient ID: Katherine Frye, female    DOB: June 10, 1945, 72 y.o.   MRN: [id number]  HPI Patient referred by Dr. Carles Collet, neurology/movement disorders Chief complaint "Low back pain"  72 year old female with past medical history significant for Parkinson's disease.  In addition patient has history dementia but is able to perform her own ADLs as well as ambulate independently.  She is no longer driving.  ~6TK hx, bilateral low back pain, pain worse with laying down, worse in the morning, sometimes gets better during the day Pain stays mainly in the back, takes ibuprofen one tablet twice a day, which is helpful.  Patient also complains of fatigue.  Lives with husband who helps her.with medications,  He does cleaning and makes sandwiches  Going to PT for mobility. No falls recently  No bladder issues, has chronic diarrhea, colonscopy , on Bentyl for spastic colon  The patient has a history of depression and is on Cymbalta.    Pain Inventory Average Pain 5 Pain Right Now 7 My pain is intermittent and burning  In the last 24 hours, has pain interfered with the following? General activity 3 Relation with others 6 Enjoyment of life 8 What TIME of day is your pain at its worst? morning Sleep (in general) Fair  Pain is worse with: walking and sitting Pain improves with: rest and medication Relief from Meds: 3  Mobility walk without assistance ability to climb steps?  yes do you drive?  no  Function retired  Neuro/Psych bowel control problems tremor trouble walking dizziness confusion depression anxiety loss of taste or smell  Prior Studies Any changes since last visit?  no  Physicians involved in your care Any changes since last visit?  no   Family History  Problem Relation Age of Onset  . Lymphoma Sister        non-hodgkin  . Cancer Sister        cancer of thyroid  . Heart disease Mother   . Heart disease Father    Social History    Socioeconomic History  . Marital status: Married    Spouse name: Refugia Laneve "Vic"  . Number of children: 2  . Years of education: 79  . Highest education level: Not on file  Occupational History  . Not on file  Social Needs  . Financial resource strain: Not on file  . Food insecurity:    Worry: Not on file    Inability: Not on file  . Transportation needs:    Medical: Not on file    Non-medical: Not on file  Tobacco Use  . Smoking status: Never Smoker  . Smokeless tobacco: Never Used  Substance and Sexual Activity  . Alcohol use: No  . Drug use: No  . Sexual activity: Not on file  Lifestyle  . Physical activity:    Days per week: Not on file    Minutes per session: Not on file  . Stress: Not on file  Relationships  . Social connections:    Talks on phone: Not on file    Gets together: Not on file    Attends religious service: Not on file    Active member of club or organization: Not on file    Attends meetings of clubs or organizations: Not on file    Relationship status: Not on file  Other Topics Concern  . Not on file  Social History Narrative   Lives at home with husband.   Caffeine use:  Drinks 1 cup coffee per day (sometimes 2 cups per day)   Past Surgical History:  Procedure Laterality Date  . APPENDECTOMY    . BREAST BIOPSY Right 2010  . BREAST CYST EXCISION Right 2006   fibroid cyst  . BREAST SURGERY Right 2010   T2, N0, ER/PR positive, HER-2/neu negative, Oncotype recurrence score  of 10 ( 7% risk of recurrence)   . COLONOSCOPY  2010   Dr. Lyndel Safe, [place]  . FOOT SURGERY  1996  . KNEE ARTHROSCOPY Right 2012   May and October  . KNEE SURGERY Right 2013   replacement  . NASAL SINUS SURGERY  1991  . SKIN CANCER EXCISION  2013   basel cell on forehead  . TONSILLECTOMY    . TUBAL LIGATION    . UPPER GI ENDOSCOPY  2010   Past Medical History:  Diagnosis Date  . Allergy 2010   medication and seasonal  . Cancer ([place]) 2010   right breast  .  Malignant neoplasm of upper-inner quadrant of female breast ([place]) 2010  . Obesity, unspecified 2013  . Solitary cyst of breast 2012   left  . Special screening for malignant neoplasms, colon 2013   BP 127/82   Pulse (!) 57   Ht 5' 10"  (1.778 m)   Wt 199 lb (90.3 kg)   SpO2 95%   BMI 28.55 kg/m   Opioid Risk Score:   Fall Risk Score:  `1  Depression screen PHQ 2/9  No flowsheet data found.   Review of Systems  Constitutional: Positive for appetite change.  HENT: Negative.   Eyes: Negative.   Respiratory: Negative.   Cardiovascular: Negative.   Gastrointestinal: Positive for diarrhea.  Endocrine: Negative.   Genitourinary: Negative.   Musculoskeletal: Positive for gait problem.  Skin: Negative.   Allergic/Immunologic: Negative.   Neurological: Positive for dizziness and tremors.  Hematological: Negative.   Psychiatric/Behavioral: Positive for confusion and dysphoric mood. The patient is nervous/anxious.   All other systems reviewed and are negative.      Objective:   Physical Exam  Constitutional: She is oriented to person, place, and time. She appears well-developed and well-nourished. No distress.  HENT:  Head: Normocephalic and atraumatic.  Eyes: Pupils are equal, round, and reactive to light. EOM are normal.  Neck: Normal range of motion.  decreaased   Cardiovascular: Normal rate, regular rhythm and normal heart sounds. Exam reveals no friction rub.  No murmur heard. Pulmonary/Chest: Effort normal and breath sounds normal. No stridor. No respiratory distress.  Abdominal: Soft. Bowel sounds are normal. She exhibits no distension. There is no tenderness.  Musculoskeletal:       Right hip: She exhibits decreased range of motion.       Left hip: Normal.  Negative straight leg raising. Lumbar spine has normal forward flexion extension is 75% of normal lateral bending is 75% of normal twisting is 50% of normal There is mild tenderness palpation bilateral right  greater than left lumbosacral paraspinals. There is no evidence of scoliosis No evidence of skin lesions in the lumbar spine region no evidence of rash.  Right hip has diminished internal rotation Negative Faber's bilaterally in terms of pain but there is diminished range of motion with external rotation on the right side as well.  Neurological: She is alert and oriented to person, place, and time. She has normal strength. Gait abnormal.  Motor strength is 5/5 bilateral deltoid bicep tricep grip hip flexor knee extensor and ankle dorsi flexor  Gait  small step length slightly wide-based support decreased arm swing.  Cerebellar testing no evidence of dysmetria finger-nose-finger  Tone shows no evidence of clonus at the ankles.  There is no cogwheel rigidity noted at the wrist elbows knees or ankles.   Skin: Skin is warm and dry. She is not diaphoretic.  Psychiatric: She has a normal mood and affect.  Nursing note and vitals reviewed.     X-rays performed today 2 views lumbar spine demonstrated right greater than left L5-S1 facet arthropathy as well as right L4-5 facet arthropathy. There is some disc space narrowing at L1-L2 which correlates with degenerative disc seen on lumbar MRI performed in 2017. The MRI in 2017 also demonstrated facet degenerative changes L3-4 L4-5 L5-S1 bilaterally.  There is no significant spinal stenosis noted.    Assessment & Plan:  1.  Chronic low back pain and elderly female with history of Parkinson's disease.  Review of x-rays show no evidence of compression fractures or spondylolisthesis.  MRI demonstrated no evidence of lumbar spinal stenosis. She does have evidence of lumbar spondylosis particularly if affecting the lower lumbar levels right greater than left side.  We discussed treatment options.  Given her Parkinson's disease as well as dementia I would avoid narcotic analgesics which would increase fall risk as well as affect her cognitive  functioning. We discussed fluoroscopically guided lumbar medial branch blocks to anesthetize the facet joint complexes and further identify pain generator.  We discussed other potential pain generators such as sacroiliac joint.  I discussed my recommendations with the patient and 2 daughters.  Elevated PHQ 9 but score affected by dementia and parkinson's, patient is on duloxetine  2.  Right hip contracture will ask PT to address this while they are doing her balance training.

## 2017-07-27 NOTE — Patient Instructions (Addendum)
Have PT address Right hip tightness  [place]

## 2017-07-28 DIAGNOSIS — G20 Parkinson's disease: Secondary | ICD-10-CM | POA: Diagnosis not present

## 2017-07-28 DIAGNOSIS — M6281 Muscle weakness (generalized): Secondary | ICD-10-CM | POA: Diagnosis not present

## 2017-07-28 DIAGNOSIS — R2689 Other abnormalities of gait and mobility: Secondary | ICD-10-CM | POA: Diagnosis not present

## 2017-07-28 DIAGNOSIS — M545 Low back pain: Secondary | ICD-10-CM | POA: Diagnosis not present

## 2017-08-05 DIAGNOSIS — R2689 Other abnormalities of gait and mobility: Secondary | ICD-10-CM | POA: Diagnosis not present

## 2017-08-05 DIAGNOSIS — M6281 Muscle weakness (generalized): Secondary | ICD-10-CM | POA: Diagnosis not present

## 2017-08-05 DIAGNOSIS — M545 Low back pain: Secondary | ICD-10-CM | POA: Diagnosis not present

## 2017-08-05 DIAGNOSIS — G20 Parkinson's disease: Secondary | ICD-10-CM | POA: Diagnosis not present

## 2017-08-10 DIAGNOSIS — Z1339 Encounter for screening examination for other mental health and behavioral disorders: Secondary | ICD-10-CM | POA: Diagnosis not present

## 2017-08-10 DIAGNOSIS — Z6830 Body mass index (BMI) 30.0-30.9, adult: Secondary | ICD-10-CM | POA: Diagnosis not present

## 2017-08-10 DIAGNOSIS — E785 Hyperlipidemia, unspecified: Secondary | ICD-10-CM | POA: Diagnosis not present

## 2017-08-10 DIAGNOSIS — J028 Acute pharyngitis due to other specified organisms: Secondary | ICD-10-CM | POA: Diagnosis not present

## 2017-08-10 DIAGNOSIS — E669 Obesity, unspecified: Secondary | ICD-10-CM | POA: Diagnosis not present

## 2017-08-10 DIAGNOSIS — Z1331 Encounter for screening for depression: Secondary | ICD-10-CM | POA: Diagnosis not present

## 2017-08-10 DIAGNOSIS — Z1231 Encounter for screening mammogram for malignant neoplasm of breast: Secondary | ICD-10-CM | POA: Diagnosis not present

## 2017-08-10 DIAGNOSIS — Z136 Encounter for screening for cardiovascular disorders: Secondary | ICD-10-CM | POA: Diagnosis not present

## 2017-08-10 DIAGNOSIS — N959 Unspecified menopausal and perimenopausal disorder: Secondary | ICD-10-CM | POA: Diagnosis not present

## 2017-08-10 DIAGNOSIS — Z0000 Encounter for general adult medical examination without abnormal findings: Secondary | ICD-10-CM | POA: Diagnosis not present

## 2017-08-10 DIAGNOSIS — Z23 Encounter for immunization: Secondary | ICD-10-CM | POA: Diagnosis not present

## 2017-08-10 DIAGNOSIS — Z9181 History of falling: Secondary | ICD-10-CM | POA: Diagnosis not present

## 2017-08-11 DIAGNOSIS — G20 Parkinson's disease: Secondary | ICD-10-CM | POA: Diagnosis not present

## 2017-08-11 DIAGNOSIS — R410 Disorientation, unspecified: Secondary | ICD-10-CM | POA: Diagnosis not present

## 2017-08-11 DIAGNOSIS — B9689 Other specified bacterial agents as the cause of diseases classified elsewhere: Secondary | ICD-10-CM | POA: Diagnosis not present

## 2017-08-11 DIAGNOSIS — N3000 Acute cystitis without hematuria: Secondary | ICD-10-CM | POA: Diagnosis not present

## 2017-08-11 DIAGNOSIS — R42 Dizziness and giddiness: Secondary | ICD-10-CM | POA: Diagnosis not present

## 2017-08-11 DIAGNOSIS — Z79899 Other long term (current) drug therapy: Secondary | ICD-10-CM | POA: Diagnosis not present

## 2017-08-11 DIAGNOSIS — F0280 Dementia in other diseases classified elsewhere without behavioral disturbance: Secondary | ICD-10-CM | POA: Diagnosis not present

## 2017-08-18 ENCOUNTER — Telehealth: Payer: Self-pay

## 2017-08-18 NOTE — Telephone Encounter (Signed)
Mrs. Fabio Neighbors called requesting information about upcoming procedure for mrs Frye, called her back, no answer, left voicemail to return call

## 2017-08-18 NOTE — Telephone Encounter (Signed)
Per ms garner ptn just finished yesterday 5 days of cephlaxin for uti no fever or other symptoms per BB okay to proceed

## 2017-08-19 ENCOUNTER — Encounter: Payer: Medicare HMO | Attending: Physical Medicine & Rehabilitation

## 2017-08-19 ENCOUNTER — Encounter: Payer: Self-pay | Admitting: Physical Medicine & Rehabilitation

## 2017-08-19 ENCOUNTER — Ambulatory Visit: Payer: Medicare HMO | Admitting: Physical Medicine & Rehabilitation

## 2017-08-19 VITALS — BP 126/85 | HR 58 | Ht 70.0 in | Wt 200.0 lb

## 2017-08-19 DIAGNOSIS — G20 Parkinson's disease: Secondary | ICD-10-CM | POA: Insufficient documentation

## 2017-08-19 DIAGNOSIS — Z79899 Other long term (current) drug therapy: Secondary | ICD-10-CM | POA: Diagnosis not present

## 2017-08-19 DIAGNOSIS — F0280 Dementia in other diseases classified elsewhere without behavioral disturbance: Secondary | ICD-10-CM | POA: Insufficient documentation

## 2017-08-19 DIAGNOSIS — M24551 Contracture, right hip: Secondary | ICD-10-CM | POA: Diagnosis not present

## 2017-08-19 DIAGNOSIS — G8929 Other chronic pain: Secondary | ICD-10-CM | POA: Insufficient documentation

## 2017-08-19 DIAGNOSIS — Z808 Family history of malignant neoplasm of other organs or systems: Secondary | ICD-10-CM | POA: Insufficient documentation

## 2017-08-19 DIAGNOSIS — Z8249 Family history of ischemic heart disease and other diseases of the circulatory system: Secondary | ICD-10-CM | POA: Insufficient documentation

## 2017-08-19 DIAGNOSIS — E669 Obesity, unspecified: Secondary | ICD-10-CM | POA: Insufficient documentation

## 2017-08-19 DIAGNOSIS — F329 Major depressive disorder, single episode, unspecified: Secondary | ICD-10-CM | POA: Insufficient documentation

## 2017-08-19 DIAGNOSIS — Z6828 Body mass index (BMI) 28.0-28.9, adult: Secondary | ICD-10-CM | POA: Diagnosis not present

## 2017-08-19 DIAGNOSIS — Z807 Family history of other malignant neoplasms of lymphoid, hematopoietic and related tissues: Secondary | ICD-10-CM | POA: Insufficient documentation

## 2017-08-19 DIAGNOSIS — M545 Low back pain: Secondary | ICD-10-CM | POA: Diagnosis not present

## 2017-08-19 DIAGNOSIS — M47816 Spondylosis without myelopathy or radiculopathy, lumbar region: Secondary | ICD-10-CM | POA: Diagnosis not present

## 2017-08-19 DIAGNOSIS — Z791 Long term (current) use of non-steroidal anti-inflammatories (NSAID): Secondary | ICD-10-CM | POA: Insufficient documentation

## 2017-08-19 DIAGNOSIS — Z85828 Personal history of other malignant neoplasm of skin: Secondary | ICD-10-CM | POA: Diagnosis not present

## 2017-08-19 DIAGNOSIS — Z853 Personal history of malignant neoplasm of breast: Secondary | ICD-10-CM | POA: Diagnosis not present

## 2017-08-19 NOTE — Progress Notes (Signed)
  PROCEDURE RECORD [facility]   Name: Katherine Frye DOB:Apr 10, 1945 MRN: [id number]  Date:08/19/2017  Physician: Alysia Penna, MD    Nurse/CMA: Bright CMA  Allergies:  Allergies  Allergen Reactions  . Levaquin [Levofloxacin In D5w] Nausea And Vomiting    Also dizziness and headaches    Consent Signed: Yes.    Is patient diabetic? No.  CBG today? NA  Pregnant: No. LMP: No LMP recorded. Patient has had a hysterectomy. (age [age])  Anticoagulants: no Anti-inflammatory: yes (Motrin this AM) Antibiotics: no  Procedure: Right L3-5 Medial Branch Blocks        Position: Prone   Start Time: 11:57am End Time: 12:02am Fluoro Time: 24s  RN/CMA Bright CMA Bright CMA    Time 11:38am 1208    BP 126/85 122/79    Pulse 58 57    Respirations 16 16    O2 Sat 95 94    S/S 6 6    Pain Level 5/10 2/10     D/C home with Daughter, patient A & O X 3, D/C instructions reviewed, and sits independently.

## 2017-08-19 NOTE — Progress Notes (Signed)

## 2017-08-19 NOTE — Progress Notes (Signed)
Right lumbar L3, L4 medial branch blocks and L5 dorsal ramus injection under fluoroscopic guidance  Indication: Right Lumbar pain which is not relieved by medication management or other conservative care and interfering with self-care and mobility.  Informed consent was obtained after describing risks and benefits of the procedure with the patient, this includes bleeding, bruising, infection, paralysis and medication side effects. The patient wishes to proceed and has given written consent. The patient was placed in a prone position. The lumbar area was marked and prepped with Betadine. One ML of 1% lidocaine was injected into each of 3 areas into the skin and subcutaneous tissue. Then a 22-gauge 3.5 in spinal needle was inserted targeting the junction of the Right S1 superior articular process and sacral ala junction. Needle was advanced under fluoroscopic guidance. Bone contact was made.Isovue 200 was injected x0.5 mL demonstrating no intravascular uptake. Then a solution containing 2% MPF lidocaine was injected x0.5 mL. Then the Right L5 superior articular process in transverse process junction was targeted. Bone contact was made.Isovue 200 was injected x0.5 mL demonstrating no intravascular uptake. Then a solution containing 2% MPF lidocaine was injected x0.5 mL. Then the Right L4 superior articular process in transverse process junction was targeted. Bone contact was made. Isovue 200 was injected x0.5 mL demonstrating no intravascular uptake. Then a solution containing2% MPF lidocaine was injected x0.5 mL Patient tolerated procedure well. Post procedure instructions were given. Please refer to post procedure form. 

## 2017-09-16 ENCOUNTER — Encounter: Payer: Medicare HMO | Attending: Physical Medicine & Rehabilitation

## 2017-09-16 ENCOUNTER — Ambulatory Visit: Payer: Medicare HMO | Admitting: Physical Medicine & Rehabilitation

## 2017-09-16 ENCOUNTER — Encounter: Payer: Self-pay | Admitting: Physical Medicine & Rehabilitation

## 2017-09-16 VITALS — BP 127/84 | HR 57 | Ht 70.0 in | Wt 201.0 lb

## 2017-09-16 DIAGNOSIS — Z791 Long term (current) use of non-steroidal anti-inflammatories (NSAID): Secondary | ICD-10-CM | POA: Insufficient documentation

## 2017-09-16 DIAGNOSIS — G8929 Other chronic pain: Secondary | ICD-10-CM | POA: Insufficient documentation

## 2017-09-16 DIAGNOSIS — G20 Parkinson's disease: Secondary | ICD-10-CM | POA: Insufficient documentation

## 2017-09-16 DIAGNOSIS — Z8249 Family history of ischemic heart disease and other diseases of the circulatory system: Secondary | ICD-10-CM | POA: Insufficient documentation

## 2017-09-16 DIAGNOSIS — Z6828 Body mass index (BMI) 28.0-28.9, adult: Secondary | ICD-10-CM | POA: Insufficient documentation

## 2017-09-16 DIAGNOSIS — M545 Low back pain: Secondary | ICD-10-CM | POA: Diagnosis not present

## 2017-09-16 DIAGNOSIS — M47816 Spondylosis without myelopathy or radiculopathy, lumbar region: Secondary | ICD-10-CM | POA: Insufficient documentation

## 2017-09-16 DIAGNOSIS — Z853 Personal history of malignant neoplasm of breast: Secondary | ICD-10-CM | POA: Insufficient documentation

## 2017-09-16 DIAGNOSIS — Z79899 Other long term (current) drug therapy: Secondary | ICD-10-CM | POA: Insufficient documentation

## 2017-09-16 DIAGNOSIS — Z85828 Personal history of other malignant neoplasm of skin: Secondary | ICD-10-CM | POA: Insufficient documentation

## 2017-09-16 DIAGNOSIS — M24551 Contracture, right hip: Secondary | ICD-10-CM | POA: Diagnosis not present

## 2017-09-16 DIAGNOSIS — F329 Major depressive disorder, single episode, unspecified: Secondary | ICD-10-CM | POA: Diagnosis not present

## 2017-09-16 DIAGNOSIS — F0280 Dementia in other diseases classified elsewhere without behavioral disturbance: Secondary | ICD-10-CM | POA: Insufficient documentation

## 2017-09-16 DIAGNOSIS — Z807 Family history of other malignant neoplasms of lymphoid, hematopoietic and related tissues: Secondary | ICD-10-CM | POA: Insufficient documentation

## 2017-09-16 DIAGNOSIS — Z808 Family history of malignant neoplasm of other organs or systems: Secondary | ICD-10-CM | POA: Insufficient documentation

## 2017-09-16 DIAGNOSIS — E669 Obesity, unspecified: Secondary | ICD-10-CM | POA: Diagnosis not present

## 2017-09-16 NOTE — Patient Instructions (Signed)

## 2017-09-16 NOTE — Progress Notes (Signed)
  PROCEDURE RECORD [facility]   Name: DASIA GUERRIER DOB:11/01/1945 MRN: [id number]  Date:09/16/2017  Physician: Alysia Penna, MD    Nurse/CMA: Ramanda Paules, CMA  Allergies:  Allergies  Allergen Reactions  . Levaquin [Levofloxacin In D5w] Nausea And Vomiting    Also dizziness and headaches    Consent Signed: Yes.    Is patient diabetic? No.  CBG today?   Pregnant: No. LMP: No LMP recorded. Patient has had a hysterectomy. (age [age])  Anticoagulants: no Anti-inflammatory: no Antibiotics: no  Procedure: right L3,4,5 medial branch block  Position: Prone Start Time: 12:05pm  End Time: 12:11pm Fluoro Time: 42s  RN/CMA Adley Mazurowski, CMA Calliope Delangel, CMA    Time 11:55am 12:15pm    BP 127/84 124/82    Pulse 57 59    Respirations 14 14    O2 Sat 94 96    S/S 6 6    Pain Level 8/10 2/10     D/C home with daughter, patient A & O X 3, D/C instructions reviewed, and sits independently.

## 2017-09-16 NOTE — Progress Notes (Signed)
Right lumbar L3, L4 medial branch blocks and L5 dorsal ramus injection under fluoroscopic guidance  Indication: Right Lumbar pain which is not relieved by medication management or other conservative care and interfering with self-care and mobility.  Informed consent was obtained after describing risks and benefits of the procedure with the patient, this includes bleeding, bruising, infection, paralysis and medication side effects. The patient wishes to proceed and has given written consent. The patient was placed in a prone position. The lumbar area was marked and prepped with Betadine. One ML of 1% lidocaine was injected into each of 3 areas into the skin and subcutaneous tissue. Then a 22-gauge 3.5 in spinal needle was inserted targeting the junction of the Right S1 superior articular process and sacral ala junction. Needle was advanced under fluoroscopic guidance. Bone contact was made.Isovue 200 was injected x0.5 mL demonstrating no intravascular uptake. Then a solution containing 2% MPF lidocaine was injected x0.5 mL. Then the Right L5 superior articular process in transverse process junction was targeted. Bone contact was made.Isovue 200 was injected x0.5 mL demonstrating no intravascular uptake. Then a solution containing 2% MPF lidocaine was injected x0.5 mL. Then the Right L4 superior articular process in transverse process junction was targeted. Bone contact was made. Isovue 200 was injected x0.5 mL demonstrating no intravascular uptake. Then a solution containing2% MPF lidocaine was injected x0.5 mL Patient tolerated procedure well. Post procedure instructions were given. Please refer to post procedure form. 

## 2017-10-01 NOTE — Progress Notes (Signed)
Katherine Frye was seen today in the movement disorders clinic for neurologic consultation at the request of Katherine Dress, MD.  The consultation is for the evaluation of Parkinson's disease.  I have reviewed prior records made available to me.  The patient has seen both Dr. [name] and Dr. [name].  Records indicate that her primary care physician is unsure that the patient has Parkinson's disease.  Patient started to see Dr. [name] in May, 2016 for memory loss and tremor.  Dr. [name] indicated that there was no evidence of Parkinson's on that examination.  Over the course of time with seeing Dr. [name], the patient had more complaints of memory loss which were felt to to depression but neurocognitive testing was recommended at [facility].  This was ultimately done in April, 2018.  This demonstrated "major neurocognitive disorder", which was defined as dementia possibly secondary to Parkinson's disease or Parkinson's plus syndrome with possible comorbid Alzheimer's disease.  It also noted depression and anxiety.  She was also worked up for the evaluation of seizure because of mental status change and EEGs were negative.  She transferred care to Dr. [name] in April, 2018 and he felt she had a parkinsonian syndrome and recommended a DaT scan.  DaT scan was done on April 28, 2016 at [facility] and was reported to show Abnormal image grade 2/3: Minimal uptake in the caudate heads bilaterally with no putamen uptake.  Dr. [name] started her on Azilect and neupro just a few days later.  She was started on carbidopa/levodopa 25/100 in 07/2016.  She ultimately d/c the neupro due to hallucinations and went up on carbidopa/levodopa 25/100 to qid.  She last saw Dr. [name] in 08/2016.  She has stopped carbidopa/levodopa right after seeing him because "nobody thought it was helpful at the time" but the tremor is much worse now that the medication is gone.  Pt thought that it also caused myalgia but she still has that.      Specific Symptoms:  Tremor: Yes.  , L arm and leg and R arm Family hx of similar:  No. Voice: no change Sleep: sleeping well  Vivid Dreams:  No.  Acting out dreams:  Yes.   , sleep talking only Wet Pillows: Yes.   Postural symptoms:  Yes.  , but minimal per family  Falls?  No. Bradykinesia symptoms: difficulty getting out of a chair Loss of smell:  No. Loss of taste:  Yes.   Urinary Incontinence:  No. Difficulty Swallowing:  No. Handwriting, micrographia: Yes.   Trouble with ADL's:  Yes.  , just slower than in the past and trouble getting arms in shirts  Trouble buttoning clothing: No. Depression:  No., but admits to frustration Memory changes:  Yes.  , lives with husband and his memory is good (he does finances/he does driving/daughter prepares pillbox x 1 year because of confusion and husband will then remind her to take med) Hallucinations:  Yes.  , with neupro patch  visual distortions: Yes.   N/V:  No. Lightheaded:  Yes.    Syncope: Yes.   but not for 1.5 years Diplopia:  No. but "weak eye on the right" for a year (blurry vision) Dyskinesia:  No.  Neuroimaging of the brain has previously been performed.  It was done in 2016 and demonstrated mild small vessel disease.  02/18/17 update:  Pt seen in follow up. This patient is accompanied in the office by her daughters who supplements the history.   Patient was  restarted on levodopa last visit, this time the CR version given memory change and the patient's believe that she had side effects with the immediate release (although I am not convinced that is the case).  Patients daughter called back not long after it was started and complained about diarrhea and mood change.  I reminded them that she has had a long history of diarrhea and has actually seen gastroenterology and been in the hospital for that.  Diarrhea is not a side effect of levodopa.  She was placed on Bentyl by gastroenterology and this has helped and she was told she  could take imodium prn.  She also complained about myalgia.  This was something that she had even off of levodopa last visit.  Depression was also another complaint.  We recommended psychiatry and she was given the contact information for behavioral health.  Taking carbidopa/levodopa CR, 1 in the AM but is woken up to take it at 7am (and then goes back to sleep until 11-12pm), one at 3pm and one at bedtime (8-10pm).  She has L arm and leg tremor.  She thinks that levodopa has helped stiffness/slowness but not the tremor.  No falls.  She does feel a tightness around her abdomen, usually lasting 61mn, somewhere between lunch and bedtime dosage.  No hallucinations.  She is not involved with community exercise.  [place].  Cramping at nighttime for the last 2 weeks  06/24/17 update: Patient is seen today in follow-up for Parkinson's.  She is accompanied by her daughter to supplement the history.  She is on carbidopa/levodopa 25/100 CR, 1 tablet 4 times per day, the last being at bedtime.  When she first wakes up she doesn't feel well and it seems to take 2 hours for first morning pills.  Pt feels that she got a new manufacturer just now and doesn't think that she feels the same.  "I have a little pain and my back has been hurting."  Patient's family called last visit because of chronic back pain.  Explained that this is not really something that I treat.  She has previously seen Dr. NRita Ohara  MRI lumbar spine done in 2017 and I reviewed that and while there was some degenerative changes, it was fairly unremarkable.   Told her that she really would need to follow back up with her primary care physician, as I did not know her history regarding back pain and who she had seen.  They request referral today to pain management. Started aricept last visit.  Tolerating that medication well.  Having some headaches but overall mild.    quit going to boxing.  "the man  who runs it is rude."  She is not seeing psychiatry or counseling any longer.  Mood has been an issue.  10/05/17 update: Patient seen today in follow-up for Parkinson's disease, accompanied by her daughter who supplements history.  Patient is on carbidopa/levodopa 25/100 CR, 1 tablet 3 times per day and carbidopa/levodopa 50/200 at bedtime.  Patient has had no falls.  No lightheadedness or near syncope.  Records are reviewed since her last visit.  The patient saw Dr. KLetta Pateon July 23 for back pain. She has a f/u appt next week.   C/o no energy.  Supposed to restart therapy this week - "I didn't go for a while."  Didn't go back to boxing.  Trying to get on PACE program to get to [facility]  day program.  Daughters biggest concern is memory.  On aricept 5 mg daily.  Daughter calls pt to see if she has done activities, etc but patient will tell her she doesn't feel good but doesn't tell her what doesn't feel good.  PREVIOUS MEDICATIONS: azilect, neupro (hallucinations), carbidopa/levodopa 25/100 IR  ALLERGIES:   Allergies  Allergen Reactions  . Levaquin [Levofloxacin In D5w] Nausea And Vomiting    Also dizziness and headaches    CURRENT MEDICATIONS:  Outpatient Encounter Medications as of 10/05/2017  Medication Sig  . BIOTIN PO Take by mouth daily.  . Calcium Carb-Cholecalciferol (CALCIUM-VITAMIN D3) 600-400 MG-UNIT CAPS Take 1 tablet by mouth daily. 1235m Ca and 1000units of vit D: For bone health  . carbidopa-levodopa (SINEMET CR) 50-200 MG tablet Take 1 tablet by mouth at bedtime.  . Carbidopa-Levodopa ER (SINEMET CR) 25-100 MG tablet controlled release Take 1 tablet by mouth 3 (three) times daily.  .Marland Kitchendicyclomine (BENTYL) 10 MG capsule Take 1 capsule (10 mg total) by mouth 2 (two) times daily.  .Marland Kitchendonepezil (ARICEPT) 5 MG tablet Take 1 tablet (5 mg total) by mouth daily.  . DULoxetine (CYMBALTA) 30 MG capsule Take 30 mg by mouth daily.  . DULoxetine (CYMBALTA) 60 MG capsule Take 1 capsule (60  mg total) by mouth at bedtime. For depression  . ibuprofen (ADVIL,MOTRIN) 200 MG tablet Take 200 mg by mouth 3 (three) times daily.   . Multiple Vitamin (MULTIVITAMIN WITH MINERALS) TABS tablet Take 1 tablet by mouth at bedtime. For low Vitamin  . Multiple Vitamins-Minerals (ICAPS MV PO) Take by mouth daily.  . vitamin B-12 (CYANOCOBALAMIN) 1000 MCG tablet Take 1 tablet (1,000 mcg total) by mouth daily. For B-12 deficiency  . Vitamin D, Cholecalciferol, 1000 units CAPS Take 7,000 Units by mouth daily. For bone health   No facility-administered encounter medications on file as of 10/05/2017.     PAST MEDICAL HISTORY:   Past Medical History:  Diagnosis Date  . Allergy 2010   medication and seasonal  . [place]   right breast  . Malignant neoplasm of upper-inner quadrant of female breast ([facility]  . Obesity, unspecified 2013  . Solitary cyst of breast 2012   left  . Special screening for malignant neoplasms, colon 2013    PAST SURGICAL HISTORY:   Past Surgical History:  Procedure Laterality Date  . APPENDECTOMY    . BREAST BIOPSY Right 2010  . BREAST CYST EXCISION Right 2006   fibroid cyst  . BREAST SURGERY Right 2010   T2, N0, ER/PR positive, HER-2/neu negative, Oncotype recurrence score  of 10 ( 7% risk of recurrence)   . COLONOSCOPY  2010   Dr. GLyndel Safe [place] . FOOT SURGERY  1996  . KNEE ARTHROSCOPY Right 2012   May and October  . KNEE SURGERY Right 2013   replacement  . NASAL SINUS SURGERY  1991  . SKIN CANCER EXCISION  2013   basel cell on forehead  . TONSILLECTOMY    . TUBAL LIGATION    . UPPER GI ENDOSCOPY  2010    SOCIAL HISTORY:   Social History   Socioeconomic History  . Marital status: Married    Spouse name: VAlyah Boehning"Vic"  . Number of children: 2  . Years of education: 158 . Highest education level: Not on file  Occupational History  . Not on file  Social Needs  . Financial resource strain: Not on file  . Food insecurity:  Worry: Not on file    Inability: Not on file  . Transportation needs:    Medical: Not on file    Non-medical: Not on file  Tobacco Use  . Smoking status: Never Smoker  . Smokeless tobacco: Never Used  Substance and Sexual Activity  . Alcohol use: No  . Drug use: No  . Sexual activity: Not on file  Lifestyle  . Physical activity:    Days per week: Not on file    Minutes per session: Not on file  . Stress: Not on file  Relationships  . Social connections:    Talks on phone: Not on file    Gets together: Not on file    Attends religious service: Not on file    Active member of club or organization: Not on file    Attends meetings of clubs or organizations: Not on file    Relationship status: Not on file  . Intimate partner violence:    Fear of current or ex partner: Not on file    Emotionally abused: Not on file    Physically abused: Not on file    Forced sexual activity: Not on file  Other Topics Concern  . Not on file  Social History Narrative   Lives at home with husband.   Caffeine use: Drinks 1 cup coffee per day (sometimes 2 cups per day)    FAMILY HISTORY:   Family Status  Relation Name Status  . Sister x6 Alive  . Mother  Deceased  . Father  Deceased  . Brother x3 Alive  . Sister  Deceased  . Daughter x2 Alive    ROS:  Review of Systems  Constitutional: Positive for malaise/fatigue.  HENT: Negative.   Eyes: Negative.   Cardiovascular: Negative.   Gastrointestinal: Negative.   Genitourinary: Negative.   Musculoskeletal: Positive for back pain.  Skin: Negative.   Neurological: Positive for tremors.  Psychiatric/Behavioral: The patient is nervous/anxious.      PHYSICAL EXAMINATION:    VITALS:   Vitals:   10/05/17 0952  BP: 120/70  Pulse: (!) 56  SpO2: 100%  Weight: 201 lb (91.2 kg)  Height: 5' 10.5" (1.791 m)    GEN:  The patient appears stated age and is in NAD.  He is intermittently tearful (appropriately so) during the visit. HEENT:   Normocephalic, atraumatic.  The mucous membranes are moist. The superficial temporal arteries are without ropiness or tenderness. CV:  rrr Lungs:  CTAB Neck/HEME:  There are no carotid bruits bilaterally.  Neurological examination:  Orientation: alert and oriented x 3 Montreal Cognitive Assessment  10/29/2016 06/05/2014  Visuospatial/ Executive (0/5) 2 3  Naming (0/3) 2 2  Attention: Read list of digits (0/2) 0 2  Attention: Read list of letters (0/1) 1 1  Attention: Serial 7 subtraction starting at 100 (0/3) 1 2  Language: Repeat phrase (0/2) 0 2  Language : Fluency (0/1) 1 1  Abstraction (0/2) 1 2  Delayed Recall (0/5) 2 4  Orientation (0/6) 5 6  Total 15 25  Adjusted Score (based on education) 16 26      Neurological examination:   Cranial nerves: There is good facial symmetry. The speech is fluent and clear. Soft palate rises symmetrically and there is no tongue deviation. Hearing is intact to conversational tone. Sensation: Sensation is intact to light touch throughout Motor: Strength is 5/5 in the bilateral upper and lower extremities.   Shoulder shrug is equal and symmetric.  There is no pronator  drift.  Movement examination: Tone: There is normal tone in the RUE.  There is normal tone in the LUE.  There is normal tone in the RLE.  There is normal tone in the LLE.  Abnormal movements: There is intermittent LUE/LLE tremor, rest Coordination:  There is mild decremation with RAM's, seen mostly on the left. Gait and Station: The patient has no difficulty arising out of a deep-seated chair without the use of the hands. The patient's stride length is normal.  She walks with good arm swing.  She is very stable.   ASSESSMENT/PLAN:  1.  idiopathic Parkinson's disease  -We discussed that it used to be thought that levodopa would increase risk of melanoma but now it is believed that Parkinsons itself likely increases risk of melanoma. she is to get regular skin checks.  -She  will continue current of/levodopa 25/100 CR at 7 AM/11 AM/4 PM.  add an extra dose if needed during the day.  -continue carbidopa/levodopa 50/200 CR due to trouble with morning "on."    -Most of this 40-minute visit was spent in counseling with the patient and her daughter.  We have talked about this last visit as well.  The biggest issue, by far, is lack of motivation and building barriers to her own health care.  Talked about defining specific activities, putting them on a white board and doing them daily.  Talked about writing her life story.  2.  Parkinson's related dementia  -Patient has had neurocognitive testing at [facility] in April, 2018.  This demonstrated evidence of dementia.  Reiterated importance of regular daily schedule and having routine.  Gave examples of what a day may look like.  -She is not driving.  -increase aricept 10 mg daily.  Risks, benefits, side effects and alternative therapies were discussed.  The opportunity to ask questions was given and they were answered to the best of my ability.  The patient expressed understanding and willingness to follow the outlined treatment protocols.  3.  low/mid back pain  -pt saw Dr. Rita Ohara in 2017.  Had MRI lumbar spine that was unrevealing and I reviewed this.  Still having the pain and will refer to Dr. Letta Pate to see if he can help pt.  Do not think that this is associated with PD.    4.  Depression  -She has information for counselors, but has not gone.  5.Follow up is anticipated in the next few months, sooner should new neurologic issues arise.   Cc:  Katherine Dress, MD

## 2017-10-05 ENCOUNTER — Encounter: Payer: Self-pay | Admitting: Neurology

## 2017-10-05 ENCOUNTER — Ambulatory Visit: Payer: Medicare HMO | Admitting: Neurology

## 2017-10-05 VITALS — BP 120/70 | HR 56 | Ht 70.5 in | Wt 201.0 lb

## 2017-10-05 DIAGNOSIS — F331 Major depressive disorder, recurrent, moderate: Secondary | ICD-10-CM

## 2017-10-05 DIAGNOSIS — F0280 Dementia in other diseases classified elsewhere without behavioral disturbance: Secondary | ICD-10-CM | POA: Diagnosis not present

## 2017-10-05 DIAGNOSIS — G20 Parkinson's disease: Secondary | ICD-10-CM

## 2017-10-05 MED ORDER — DONEPEZIL HCL 10 MG PO TABS: 10.0000 mg | ORAL_TABLET | Freq: Every day | ORAL | 1 refills | Status: DC

## 2017-10-05 NOTE — Patient Instructions (Addendum)
1.  Increase aricept (donepezil) to 10 mg daily.  You can use two of the 5-mg for now until yours run out and then increase to one 10 mg daily 2.  You need to get a whiteboard and put in a regular schedule, which includes physical and mental exercises.

## 2017-10-07 DIAGNOSIS — R2689 Other abnormalities of gait and mobility: Secondary | ICD-10-CM | POA: Diagnosis not present

## 2017-10-07 DIAGNOSIS — M6281 Muscle weakness (generalized): Secondary | ICD-10-CM | POA: Diagnosis not present

## 2017-10-11 DIAGNOSIS — R2689 Other abnormalities of gait and mobility: Secondary | ICD-10-CM | POA: Diagnosis not present

## 2017-10-11 DIAGNOSIS — M6281 Muscle weakness (generalized): Secondary | ICD-10-CM | POA: Diagnosis not present

## 2017-10-13 DIAGNOSIS — M6281 Muscle weakness (generalized): Secondary | ICD-10-CM | POA: Diagnosis not present

## 2017-10-13 DIAGNOSIS — R2689 Other abnormalities of gait and mobility: Secondary | ICD-10-CM | POA: Diagnosis not present

## 2017-10-13 DIAGNOSIS — G20 Parkinson's disease: Secondary | ICD-10-CM | POA: Diagnosis not present

## 2017-10-14 ENCOUNTER — Encounter: Payer: Self-pay | Admitting: Physical Medicine & Rehabilitation

## 2017-10-14 ENCOUNTER — Encounter: Payer: Medicare HMO | Attending: Physical Medicine & Rehabilitation

## 2017-10-14 ENCOUNTER — Other Ambulatory Visit: Payer: Self-pay

## 2017-10-14 ENCOUNTER — Ambulatory Visit: Payer: Medicare HMO | Admitting: Physical Medicine & Rehabilitation

## 2017-10-14 VITALS — BP 156/93 | HR 54 | Ht 69.5 in | Wt 202.4 lb

## 2017-10-14 DIAGNOSIS — M47816 Spondylosis without myelopathy or radiculopathy, lumbar region: Secondary | ICD-10-CM | POA: Diagnosis not present

## 2017-10-14 DIAGNOSIS — Z79899 Other long term (current) drug therapy: Secondary | ICD-10-CM | POA: Insufficient documentation

## 2017-10-14 DIAGNOSIS — M24551 Contracture, right hip: Secondary | ICD-10-CM | POA: Diagnosis not present

## 2017-10-14 DIAGNOSIS — F329 Major depressive disorder, single episode, unspecified: Secondary | ICD-10-CM | POA: Diagnosis not present

## 2017-10-14 DIAGNOSIS — F0280 Dementia in other diseases classified elsewhere without behavioral disturbance: Secondary | ICD-10-CM | POA: Insufficient documentation

## 2017-10-14 DIAGNOSIS — G20 Parkinson's disease: Secondary | ICD-10-CM | POA: Insufficient documentation

## 2017-10-14 DIAGNOSIS — Z808 Family history of malignant neoplasm of other organs or systems: Secondary | ICD-10-CM | POA: Insufficient documentation

## 2017-10-14 DIAGNOSIS — Z85828 Personal history of other malignant neoplasm of skin: Secondary | ICD-10-CM | POA: Insufficient documentation

## 2017-10-14 DIAGNOSIS — M545 Low back pain: Secondary | ICD-10-CM | POA: Insufficient documentation

## 2017-10-14 DIAGNOSIS — Z807 Family history of other malignant neoplasms of lymphoid, hematopoietic and related tissues: Secondary | ICD-10-CM | POA: Insufficient documentation

## 2017-10-14 DIAGNOSIS — Z8249 Family history of ischemic heart disease and other diseases of the circulatory system: Secondary | ICD-10-CM | POA: Insufficient documentation

## 2017-10-14 DIAGNOSIS — Z791 Long term (current) use of non-steroidal anti-inflammatories (NSAID): Secondary | ICD-10-CM | POA: Insufficient documentation

## 2017-10-14 DIAGNOSIS — E669 Obesity, unspecified: Secondary | ICD-10-CM | POA: Insufficient documentation

## 2017-10-14 DIAGNOSIS — Z6828 Body mass index (BMI) 28.0-28.9, adult: Secondary | ICD-10-CM | POA: Diagnosis not present

## 2017-10-14 DIAGNOSIS — G8929 Other chronic pain: Secondary | ICD-10-CM | POA: Diagnosis not present

## 2017-10-14 DIAGNOSIS — Z853 Personal history of malignant neoplasm of breast: Secondary | ICD-10-CM | POA: Diagnosis not present

## 2017-10-14 NOTE — Progress Notes (Signed)
Subjective:    Patient ID: Katherine Frye, female    DOB: 08-16-1945, 72 y.o.   MRN: [id number]  HPI Right sided low back pain Has tried tylenol and ibuprofen with partial relief , pain still mod to severe .  No improvement with PT.  Pain present for >55mo Right L3-4-5 MBB 09/16/17 pre inject 8/10, post injection 2/10 RIght L3-4-5 MBB 08/19/17 pre inject 5/10, post inj 2/10 Pain is back to 5 or 6 out of 10 range. She is accompanied by her daughter. Pain Inventory Average Pain 5 Pain Right Now 6 My pain is sharp, burning and aching  In the last 24 hours, has pain interfered with the following? General activity 3 Relation with others 3 Enjoyment of life 3 What TIME of day is your pain at its worst? morning Sleep (in general) NA  Pain is worse with: sitting Pain improves with: therapy/exercise, medication and injections Relief from Meds: 1  Mobility walk without assistance ability to climb steps?  yes do you drive?  no  Function retired I need assistance with the following:  meal prep, household duties and shopping  Neuro/Psych tremor dizziness confusion depression anxiety  Prior Studies Any changes since last visit?  no  Physicians involved in your care Any changes since last visit?  no   Family History  Problem Relation Age of Onset  . Lymphoma Sister        non-hodgkin  . Cancer Sister        cancer of thyroid  . Heart disease Mother   . Heart disease Father    Social History   Socioeconomic History  . Marital status: Married    Spouse name: VEtta Gassett"Vic"  . Number of children: 2  . Years of education: 178 . Highest education level: Not on file  Occupational History  . Not on file  Social Needs  . Financial resource strain: Not on file  . Food insecurity:    Worry: Not on file    Inability: Not on file  . Transportation needs:    Medical: Not on file    Non-medical: Not on file  Tobacco Use  . Smoking status: Never Smoker  . Smokeless  tobacco: Never Used  Substance and Sexual Activity  . Alcohol use: No  . Drug use: No  . Sexual activity: Not on file  Lifestyle  . Physical activity:    Days per week: Not on file    Minutes per session: Not on file  . Stress: Not on file  Relationships  . Social connections:    Talks on phone: Not on file    Gets together: Not on file    Attends religious service: Not on file    Active member of club or organization: Not on file    Attends meetings of clubs or organizations: Not on file    Relationship status: Not on file  Other Topics Concern  . Not on file  Social History Narrative   Lives at home with husband.   Caffeine use: Drinks 1 cup coffee per day (sometimes 2 cups per day)   Past Surgical History:  Procedure Laterality Date  . APPENDECTOMY    . BREAST BIOPSY Right 2010  . BREAST CYST EXCISION Right 2006   fibroid cyst  . BREAST SURGERY Right 2010   T2, N0, ER/PR positive, HER-2/neu negative, Oncotype recurrence score  of 10 ( 7% risk of recurrence)   . COLONOSCOPY  2010   Dr.  Brandt Loosen  . FOOT SURGERY  1996  . KNEE ARTHROSCOPY Right 2012   May and October  . KNEE SURGERY Right 2013   replacement  . NASAL SINUS SURGERY  1991  . SKIN CANCER EXCISION  2013   basel cell on forehead  . TONSILLECTOMY    . TUBAL LIGATION    . UPPER GI ENDOSCOPY  2010   Past Medical History:  Diagnosis Date  . Allergy 2010   medication and seasonal  . Cancer ([place]) 2010   right breast  . Malignant neoplasm of upper-inner quadrant of female breast ([place]) 2010  . Obesity, unspecified 2013  . Solitary cyst of breast 2012   left  . Special screening for malignant neoplasms, colon 2013   BP (!) 156/93   Pulse (!) 54   Ht 5' 9.5" (1.765 m) Comment: reported  Wt 202 lb 6.4 oz (91.8 kg)   SpO2 94%   BMI 29.46 kg/m   Opioid Risk Score:   Fall Risk Score:  `1  Depression screen PHQ 2/9  Depression screen PHQ 2/9 10/14/2017  Decreased Interest 3  Down,  Depressed, Hopeless 3  PHQ - 2 Score 6    Review of Systems  Constitutional: Positive for appetite change and chills.  HENT: Negative.   Eyes: Negative.   Respiratory: Positive for cough.   Cardiovascular: Negative.   Gastrointestinal: Positive for diarrhea.  Endocrine: Negative.   Genitourinary: Negative.   Musculoskeletal: Negative.   Skin: Negative.   Allergic/Immunologic: Negative.   Neurological: Positive for dizziness and tremors.  Hematological: Negative.   Psychiatric/Behavioral: Positive for confusion and dysphoric mood. The patient is nervous/anxious.   All other systems reviewed and are negative.      Objective:   Physical Exam  Constitutional: She is oriented to person, place, and time. She appears well-developed and well-nourished. No distress.  HENT:  Head: Normocephalic and atraumatic.  Eyes: Pupils are equal, round, and reactive to light. EOM are normal.  Neck: Normal range of motion.  Musculoskeletal:       Lumbar back: She exhibits decreased range of motion and tenderness. She exhibits no deformity and no spasm.  Patient has mild tenderness to palpation bilateral lumbar region she has pain with extension and limitation with lumbar extension to 25% of normal.  Lumbar flexion is 50% of normal and has no pain.  Neurological: She is alert and oriented to person, place, and time.  Motor strength is 5/5 bilateral hip flexor knee extensor ankle dorsi flexor Sensation intact bilateral lower extremity. Negative straight leg raise bilaterally.  Skin: Skin is warm and dry. She is not diaphoretic.  Psychiatric: She has a normal mood and affect.  Nursing note and vitals reviewed.   Left resting hand tremor      Assessment & Plan:  1.  Lumbar spondylosis without myelopathy, patient had two sets of L3-L4 medial branch and L5 dorsal ramus injections each producing a greater than 50% relief.  As discussed with patient daughter she would be a good candidate for  radiofrequency neurotomy.  She has not responded to conservative care such as physical therapy and over-the-counter medications.  She is also not a good candidate for narcotic analgesics given her history of Parkinson's disease with fall risk.

## 2017-10-18 DIAGNOSIS — M6281 Muscle weakness (generalized): Secondary | ICD-10-CM | POA: Diagnosis not present

## 2017-10-18 DIAGNOSIS — G20 Parkinson's disease: Secondary | ICD-10-CM | POA: Diagnosis not present

## 2017-10-18 DIAGNOSIS — R2689 Other abnormalities of gait and mobility: Secondary | ICD-10-CM | POA: Diagnosis not present

## 2017-10-20 DIAGNOSIS — M6281 Muscle weakness (generalized): Secondary | ICD-10-CM | POA: Diagnosis not present

## 2017-10-20 DIAGNOSIS — R2689 Other abnormalities of gait and mobility: Secondary | ICD-10-CM | POA: Diagnosis not present

## 2017-10-27 DIAGNOSIS — M6281 Muscle weakness (generalized): Secondary | ICD-10-CM | POA: Diagnosis not present

## 2017-10-27 DIAGNOSIS — R2689 Other abnormalities of gait and mobility: Secondary | ICD-10-CM | POA: Diagnosis not present

## 2017-11-04 ENCOUNTER — Ambulatory Visit: Payer: Medicare HMO | Admitting: Physical Medicine & Rehabilitation

## 2017-11-04 VITALS — BP 117/80 | HR 55 | Ht 70.0 in | Wt 206.0 lb

## 2017-11-04 DIAGNOSIS — G8929 Other chronic pain: Secondary | ICD-10-CM | POA: Diagnosis not present

## 2017-11-04 DIAGNOSIS — M545 Low back pain: Secondary | ICD-10-CM | POA: Diagnosis not present

## 2017-11-04 DIAGNOSIS — E669 Obesity, unspecified: Secondary | ICD-10-CM | POA: Diagnosis not present

## 2017-11-04 DIAGNOSIS — Z791 Long term (current) use of non-steroidal anti-inflammatories (NSAID): Secondary | ICD-10-CM | POA: Diagnosis not present

## 2017-11-04 DIAGNOSIS — M24551 Contracture, right hip: Secondary | ICD-10-CM | POA: Diagnosis not present

## 2017-11-04 DIAGNOSIS — M47816 Spondylosis without myelopathy or radiculopathy, lumbar region: Secondary | ICD-10-CM | POA: Diagnosis not present

## 2017-11-04 DIAGNOSIS — F0280 Dementia in other diseases classified elsewhere without behavioral disturbance: Secondary | ICD-10-CM | POA: Diagnosis not present

## 2017-11-04 DIAGNOSIS — G20 Parkinson's disease: Secondary | ICD-10-CM | POA: Diagnosis not present

## 2017-11-04 DIAGNOSIS — Z853 Personal history of malignant neoplasm of breast: Secondary | ICD-10-CM | POA: Diagnosis not present

## 2017-11-04 NOTE — Patient Instructions (Signed)

## 2017-11-04 NOTE — Progress Notes (Signed)
  PROCEDURE RECORD [facility]   Name: Katherine Frye DOB:November 29, 1945 MRN: [id number]  Date:11/04/2017  Physician: Alysia Penna, MD    Nurse/CMA: Bright CMA   Allergies:  Allergies  Allergen Reactions  . Levaquin [Levofloxacin In D5w] Nausea And Vomiting    Also dizziness and headaches    Consent Signed: Yes.    Is patient diabetic? No.  CBG today? NA  Pregnant: No. LMP: No LMP recorded. Patient has had a hysterectomy. (age 26-55)  Anticoagulants: no Anti-inflammatory: yes (ibuprofen today) Antibiotics: no  Procedure: Right L3-5 Radiofrequency Neurotomy Position: Prone   Start Time: 11:26am End Time: 11:40am Fluoro Time: 38s   RN/CMA Bright CMA Bright CMA    Time 11:05am 11:47am    BP 117/80 153/82    Pulse 55 56    Respirations 16 16    O2 Sat 95 94    S/S 6 6    Pain Level 5/10 0/10     D/C home with Daughter, patient A & O X 3, D/C instructions reviewed, and sits independently.

## 2017-11-04 NOTE — Progress Notes (Signed)

## 2017-11-11 ENCOUNTER — Encounter

## 2017-11-11 ENCOUNTER — Ambulatory Visit: Payer: Self-pay | Admitting: Neurology

## 2018-03-15 NOTE — Progress Notes (Deleted)
Katherine Frye was seen today in the movement disorders clinic for neurologic consultation at the request of [name], MD.  The consultation is for the evaluation of Parkinson's disease.  I have reviewed prior records made available to me.  The patient has seen both Dr. [name] and Dr. [name].  Records indicate that her primary care physician is unsure that the patient has Parkinson's disease.  Patient started to see Dr. [name] in May, 2016 for memory loss and tremor.  Dr. [name] indicated that there was no evidence of Parkinson's on that examination.  Over the course of time with seeing Dr. [name], the patient had more complaints of memory loss which were felt to to depression but neurocognitive testing was recommended at [facility].  This was ultimately done in April, 2018.  This demonstrated "major neurocognitive disorder", which was defined as dementia possibly secondary to Parkinson's disease or Parkinson's plus syndrome with possible comorbid Alzheimer's disease.  It also noted depression and anxiety.  She was also worked up for the evaluation of seizure because of mental status change and EEGs were negative.  She transferred care to Dr. [name] in April, 2018 and he felt she had a parkinsonian syndrome and recommended a DaT scan.  DaT scan was done on April 28, 2016 at [facility] and was reported to show Abnormal image grade 2/3: Minimal uptake in the caudate heads bilaterally with no putamen uptake.  Dr. [name] started her on Azilect and neupro just a few days later.  She was started on carbidopa/levodopa 25/100 in 07/2016.  She ultimately d/c the neupro due to hallucinations and went up on carbidopa/levodopa 25/100 to qid.  She last saw Dr. [name] in 08/2016.  She has stopped carbidopa/levodopa right after seeing him because "nobody thought it was helpful at the time" but the tremor is much worse now that the medication is gone.  Pt thought that it also caused myalgia but she still has that.      Specific Symptoms:  Tremor: Yes.  , L arm and leg and R arm Family hx of similar:  No. Voice: no change Sleep: sleeping well  Vivid Dreams:  No.  Acting out dreams:  Yes.   , sleep talking only Wet Pillows: Yes.   Postural symptoms:  Yes.  , but minimal per family  Falls?  No. Bradykinesia symptoms: difficulty getting out of a chair Loss of smell:  No. Loss of taste:  Yes.   Urinary Incontinence:  No. Difficulty Swallowing:  No. Handwriting, micrographia: Yes.   Trouble with ADL's:  Yes.  , just slower than in the past and trouble getting arms in shirts  Trouble buttoning clothing: No. Depression:  No., but admits to frustration Memory changes:  Yes.  , lives with husband and his memory is good (he does finances/he does driving/daughter prepares pillbox x 1 year because of confusion and husband will then remind her to take med) Hallucinations:  Yes.  , with neupro patch  visual distortions: Yes.   N/V:  No. Lightheaded:  Yes.    Syncope: Yes.   but not for 1.5 years Diplopia:  No. but "weak eye on the right" for a year (blurry vision) Dyskinesia:  No.  Neuroimaging of the brain has previously been performed.  It was done in 2016 and demonstrated mild small vessel disease.  02/18/17 update:  Pt seen in follow up. This patient is accompanied in the office by her daughters who supplements the history.   Patient was  restarted on levodopa last visit, this time the CR version given memory change and the patient's believe that she had side effects with the immediate release (although I am not convinced that is the case).  Patients daughter called back not long after it was started and complained about diarrhea and mood change.  I reminded them that she has had a long history of diarrhea and has actually seen gastroenterology and been in the hospital for that.  Diarrhea is not a side effect of levodopa.  She was placed on Bentyl by gastroenterology and this has helped and she was told she  could take imodium prn.  She also complained about myalgia.  This was something that she had even off of levodopa last visit.  Depression was also another complaint.  We recommended psychiatry and she was given the contact information for behavioral health.  Taking carbidopa/levodopa CR, 1 in the AM but is woken up to take it at 7am (and then goes back to sleep until 11-12pm), one at 3pm and one at bedtime (8-10pm).  She has L arm and leg tremor.  She thinks that levodopa has helped stiffness/slowness but not the tremor.  No falls.  She does feel a tightness around her abdomen, usually lasting 61mn, somewhere between lunch and bedtime dosage.  No hallucinations.  She is not involved with community exercise.  [place] two [place].  Cramping at nighttime for the last 2 weeks  06/24/17 update: Patient is seen today in follow-up for Parkinson's.  She is accompanied by her daughter to supplement the history.  She is on carbidopa/levodopa 25/100 CR, 1 tablet 4 times per day, the last being at bedtime.  When she first wakes up she doesn't feel well and it seems to take 2 hours for first morning pills.  Pt feels that she got a new manufacturer just now and doesn't think that she feels the same.  "I have a little pain and my back has been hurting."  Patient's family called last visit because of chronic back pain.  Explained that this is not really something that I treat.  She has previously seen Dr. NRita Ohara  MRI lumbar spine done in 2017 and I reviewed that and while there was some degenerative changes, it was fairly unremarkable.   Told her that she really would need to follow back up with her primary care physician, as I did not know her history regarding back pain and who she had seen.  They request referral today to pain management. Started aricept last visit.  Tolerating that medication well.  Having some headaches but overall mild.    quit going to boxing.  "the man  who runs it is rude."  She is not seeing psychiatry or counseling any longer.  Mood has been an issue.  10/05/17 update: Patient seen today in follow-up for Parkinson's disease, accompanied by her daughter who supplements history.  Patient is on carbidopa/levodopa 25/100 CR, 1 tablet 3 times per day and carbidopa/levodopa 50/200 at bedtime.  Patient has had no falls.  No lightheadedness or near syncope.  Records are reviewed since her last visit.  The patient saw Dr. KLetta Pateon July 23 for back pain. She has a f/u appt next week.   C/o no energy.  Supposed to restart therapy this week - "I didn't go for a while."  Didn't go back to boxing.  Trying to get on PACE program to get to [facility].  Daughters biggest concern is memory.  On aricept 5 mg daily.  Daughter calls pt to see if she has done activities, etc but patient will tell her she doesn't feel good but doesn't tell her what doesn't feel good.  03/17/18 update: Patient is seen today in follow-up for Parkinson's disease, accompanied by her daughter who supplements history.  Patient is on carbidopa/levodopa 25/100, 1 tablet 3 times per day and carbidopa/levodopa 50/200 at bedtime.  She is on donepezil, 10 mg at night for memory.  She has had no falls.  No lightheadedness or near syncope.  She does not exercise.  She last saw Dr. Letta Pate on October 31.  Those records have been reviewed.  PREVIOUS MEDICATIONS: azilect, neupro (hallucinations), carbidopa/levodopa 25/100 IR  ALLERGIES:   Allergies  Allergen Reactions  . Levaquin [Levofloxacin In D5w] Nausea And Vomiting    Also dizziness and headaches    CURRENT MEDICATIONS:  Outpatient Encounter Medications as of 03/17/2018  Medication Sig  . BIOTIN PO Take by mouth daily.  . Calcium Carb-Cholecalciferol (CALCIUM-VITAMIN D3) 600-400 MG-UNIT CAPS Take 1 tablet by mouth daily. 1252m Ca and 1000units of vit D: For bone health  . carbidopa-levodopa (SINEMET CR) 50-200 MG tablet Take 1  tablet by mouth at bedtime.  . Carbidopa-Levodopa ER (SINEMET CR) 25-100 MG tablet controlled release Take 1 tablet by mouth 3 (three) times daily.  .Marland Kitchendicyclomine (BENTYL) 10 MG capsule Take 1 capsule (10 mg total) by mouth 2 (two) times daily.  .Marland Kitchendonepezil (ARICEPT) 10 MG tablet Take 1 tablet (10 mg total) by mouth at bedtime.  . DULoxetine (CYMBALTA) 30 MG capsule Take 30 mg by mouth daily. In am  . DULoxetine (CYMBALTA) 60 MG capsule Take 1 capsule (60 mg total) by mouth at bedtime. For depression  . ibuprofen (ADVIL,MOTRIN) 200 MG tablet Take 200 mg by mouth 3 (three) times daily.   . Multiple Vitamin (MULTIVITAMIN WITH MINERALS) TABS tablet Take 1 tablet by mouth at bedtime. For low Vitamin  . Multiple Vitamins-Minerals (ICAPS MV PO) Take by mouth daily.  . vitamin B-12 (CYANOCOBALAMIN) 1000 MCG tablet Take 1 tablet (1,000 mcg total) by mouth daily. For B-12 deficiency  . Vitamin D, Cholecalciferol, 1000 units CAPS Take 7,000 Units by mouth daily. For bone health   No facility-administered encounter medications on file as of 03/17/2018.     PAST MEDICAL HISTORY:   Past Medical History:  Diagnosis Date  . Allergy 2010   medication and seasonal  . Cancer ([facility] 2010   right breast  . Malignant neoplasm of upper-inner quadrant of female breast ([facility] 2010  . Obesity, unspecified 2013  . Solitary cyst of breast 2012   left  . Special screening for malignant neoplasms, colon 2013    PAST SURGICAL HISTORY:   Past Surgical History:  Procedure Laterality Date  . APPENDECTOMY    . BREAST BIOPSY Right 2010  . BREAST CYST EXCISION Right 2006   fibroid cyst  . BREAST SURGERY Right 2010   T2, N0, ER/PR positive, HER-2/neu negative, Oncotype recurrence score  of 10 ( 7% risk of recurrence)   . COLONOSCOPY  2010   [place]. [place] . FOOT SURGERY  1996  . KNEE ARTHROSCOPY Right 2012   May and October  . KNEE SURGERY Right 2013   replacement  . NASAL SINUS SURGERY  1991  . SKIN  CANCER EXCISION  2013   basel cell on forehead  . TONSILLECTOMY    .  TUBAL LIGATION    . UPPER GI ENDOSCOPY  2010    SOCIAL HISTORY:   Social History   Socioeconomic History  . Marital status: Married    Spouse name: Zahava Quant "Vic"  . Number of children: 2  . Years of education: 56  . Highest education level: Not on file  Occupational History  . Not on file  Social Needs  . Financial resource strain: Not on file  . Food insecurity:    Worry: Not on file    Inability: Not on file  . Transportation needs:    Medical: Not on file    Non-medical: Not on file  Tobacco Use  . Smoking status: Never Smoker  . Smokeless tobacco: Never Used  Substance and Sexual Activity  . Alcohol use: No  . Drug use: No  . Sexual activity: Not on file  Lifestyle  . Physical activity:    Days per week: Not on file    Minutes per session: Not on file  . Stress: Not on file  Relationships  . Social connections:    Talks on phone: Not on file    Gets together: Not on file    Attends religious service: Not on file    Active member of club or organization: Not on file    Attends meetings of clubs or organizations: Not on file    Relationship status: Not on file  . Intimate partner violence:    Fear of current or ex partner: Not on file    Emotionally abused: Not on file    Physically abused: Not on file    Forced sexual activity: Not on file  Other Topics Concern  . Not on file  Social History Narrative   Lives at home with husband.   Caffeine use: Drinks 1 cup coffee per day (sometimes 2 cups per day)    FAMILY HISTORY:   Family Status  Relation Name Status  . Sister x6 Alive  . Mother  Deceased  . Father  Deceased  . Brother x3 Alive  . Sister  Deceased  . Daughter x2 Alive    ROS:  ROS   PHYSICAL EXAMINATION:    VITALS:   There were no vitals filed for this visit.  GEN:  The patient appears stated age and is in NAD.  He is intermittently tearful (appropriately so)  during the visit. HEENT:  Normocephalic, atraumatic.  The mucous membranes are moist. The superficial temporal arteries are without ropiness or tenderness. CV:  rrr Lungs:  CTAB Neck/HEME:  There are no carotid bruits bilaterally.  Neurological examination:  Orientation: alert and oriented x 3 Montreal Cognitive Assessment  10/29/2016 06/05/2014  Visuospatial/ Executive (0/5) 2 3  Naming (0/3) 2 2  Attention: Read list of digits (0/2) 0 2  Attention: Read list of letters (0/1) 1 1  Attention: Serial 7 subtraction starting at 100 (0/3) 1 2  Language: Repeat phrase (0/2) 0 2  Language : Fluency (0/1) 1 1  Abstraction (0/2) 1 2  Delayed Recall (0/5) 2 4  Orientation (0/6) 5 6  Total 15 25  Adjusted Score (based on education) 16 26      Neurological examination:   Cranial nerves: There is good facial symmetry. The speech is fluent and clear. Soft palate rises symmetrically and there is no tongue deviation. Hearing is intact to conversational tone. Sensation: Sensation is intact to light touch throughout Motor: Strength is 5/5 in the bilateral upper and lower extremities.  Shoulder shrug is equal and symmetric.  There is no pronator drift.  Movement examination: Tone: There is normal tone in the RUE.  There is normal tone in the LUE.  There is normal tone in the RLE.  There is normal tone in the LLE.  Abnormal movements: There is intermittent LUE/LLE tremor, rest Coordination:  There is mild decremation with RAM's, seen mostly on the left. Gait and Station: The patient has no difficulty arising out of a deep-seated chair without the use of the hands. The patient's stride length is normal.  She walks with good arm swing.  She is very stable.   ASSESSMENT/PLAN:  1.  idiopathic Parkinson's disease  -We discussed that it used to be thought that levodopa would increase risk of melanoma but now it is believed that Parkinsons itself likely increases risk of melanoma. she is to get  regular skin checks.  -She will continue current of/levodopa 25/100 CR at 7 AM/11 AM/4 PM.  add an extra dose if needed during the day.  -continue carbidopa/levodopa 50/200 CR due to trouble with morning "on."    -***Most of this 40-minute visit was spent in counseling with the patient and her daughter.  We have talked about this last visit as well.  The biggest issue, by far, is lack of motivation and building barriers to her own health care.  Talked about defining specific activities, putting them on a white board and doing them daily.  Talked about writing her life story.  2.  Parkinson's related dementia  -Patient has had neurocognitive testing at [facility] in April, 2018.  This demonstrated evidence of dementia.  Reiterated importance of regular daily schedule and having routine.  Gave examples of what a day may look like.  -She is not driving.  -increase aricept 10 mg daily.  Risks, benefits, side effects and alternative therapies were discussed.  The opportunity to ask questions was given and they were answered to the best of my ability.  The patient expressed understanding and willingness to follow the outlined treatment protocols.  3.  low/mid back pain  -pt saw Dr. Rita Ohara in 2017.  Had MRI lumbar spine that was unrevealing and I reviewed this.  Still having the pain and will refer to Dr. Letta Pate to see if he can help pt.  Do not think that this is associated with PD.    4.  Depression  -She has information for counselors, but has not gone.  5.***   Cc:  [name], MD

## 2018-03-17 ENCOUNTER — Ambulatory Visit: Payer: Self-pay | Admitting: Neurology

## 2018-04-05 ENCOUNTER — Ambulatory Visit: Payer: Self-pay | Admitting: Physical Medicine & Rehabilitation

## 2018-08-20 DIAGNOSIS — I4892 Unspecified atrial flutter: Secondary | ICD-10-CM

## 2018-08-20 DIAGNOSIS — G20 Parkinson's disease: Secondary | ICD-10-CM

## 2018-08-20 DIAGNOSIS — R55 Syncope and collapse: Secondary | ICD-10-CM

## 2018-08-20 DIAGNOSIS — R5381 Other malaise: Secondary | ICD-10-CM

## 2018-08-21 DIAGNOSIS — R55 Syncope and collapse: Secondary | ICD-10-CM | POA: Diagnosis not present

## 2018-08-21 DIAGNOSIS — R5381 Other malaise: Secondary | ICD-10-CM | POA: Diagnosis not present

## 2018-08-21 DIAGNOSIS — I4892 Unspecified atrial flutter: Secondary | ICD-10-CM | POA: Diagnosis not present

## 2018-08-21 DIAGNOSIS — G20 Parkinson's disease: Secondary | ICD-10-CM | POA: Diagnosis not present

## 2018-10-24 ENCOUNTER — Telehealth: Payer: Self-pay | Admitting: Neurology

## 2018-10-24 NOTE — Telephone Encounter (Signed)
Pt is being referred back to our office for a second opinion on parkinsons and dementia. Pt has seen you in the past but most recently has seen Dr. Carles Collet with [facility]. Are you comfortable seeing this patient for this condition or would you prefer to ask another provider?

## 2018-10-24 NOTE — Telephone Encounter (Signed)
Since it has been over 3 years I would like her to be seen by another provider in the office. It has been 3 years so she is technically a new patient. Thank you !!!

## 2018-10-25 NOTE — Telephone Encounter (Signed)
Would you be willing to see patient for this?

## 2018-10-31 NOTE — Telephone Encounter (Signed)
She has seen Dr. Carles Collet, if she wants another opinion please consider it be sent to our movement disorder specialist in the office.

## 2018-10-31 NOTE — Telephone Encounter (Addendum)
Per Dr Leta Baptist, called referring office and spoke to [facility], referral staff. I advised her of patient's previous appointments with Dr [name], [facility], Dr [name], [place] and Dr Aquilla Solian, [facility] disorder clinic. I advised her Dr [name] referred patient for neurocognitive testing as well.  She will discuss with referring provider, Isla Pence NP who was out of the office today.

## 2018-10-31 NOTE — Telephone Encounter (Signed)
Referral notes on Dr [facility] desk for review.

## 2018-11-01 NOTE — Telephone Encounter (Addendum)
Received call back from [facility] who stated the referring dr, Dr Erasmo Downer would like a review of patient's medications because she's not responding. Dr Leta Baptist agreed to talk to Dr Marcello Moores and called on his direct line.

## 2018-11-01 NOTE — Telephone Encounter (Signed)
As discussed, I would be happy to weigh in for a one time consult as requested.

## 2018-11-01 NOTE — Telephone Encounter (Signed)
I spoke with Dr. Marcello Moores [phone]), medical director of [facility] regarding patient.    Patient previously seen 3 different neurologists (Dr. [name], Dr. [name], Dr. Carles Collet). Patient currently on Rytary (36.25 / 145) 1 tab three times a day; higher doses were associated with orthostatic hypotension.  Patient having progressive difficulty withdizziness, gait and balance, memory loss, tremor.  They are requesting neurologic evaluation to make recommendations on medication management of Rytary, clarification on diagnosis of parkinson's disease, and any other recommendations from neurologic standpoint.  I reviewed case with Dr. Rexene Alberts who kindly agreed to see patient as an additional opinion / consult.    Penni Bombard, MD [id number], [id number] PM Certified in Neurology, Neurophysiology and Neuroimaging  [facility] [address], [place], [place] [address] [phone]

## 2018-11-07 ENCOUNTER — Encounter: Payer: Self-pay | Admitting: Neurology

## 2018-11-07 ENCOUNTER — Ambulatory Visit (INDEPENDENT_AMBULATORY_CARE_PROVIDER_SITE_OTHER): Payer: No Typology Code available for payment source | Admitting: Neurology

## 2018-11-07 ENCOUNTER — Other Ambulatory Visit: Payer: Self-pay

## 2018-11-07 VITALS — BP 136/79 | HR 50 | Temp 97.3°F | Ht 70.5 in | Wt 229.0 lb

## 2018-11-07 DIAGNOSIS — F0280 Dementia in other diseases classified elsewhere without behavioral disturbance: Secondary | ICD-10-CM

## 2018-11-07 DIAGNOSIS — G20 Parkinson's disease: Secondary | ICD-10-CM | POA: Diagnosis not present

## 2018-11-07 NOTE — Patient Instructions (Addendum)
I believe you have Advanced left-sided predominant Parkinson's disease.  I would recommend that you continue with Sinemet ER or long-acting levodopa 25-100 mg strength 1 tablet 3 times a day, at 8 AM, 12, and 4 PM.  Please continue with the Sinemet CR, 50-200 mg strength at bedtime around 11 PM.  Please use your walker at all times for gait safety.  Please try to hydrate well but do not overdo it, try to drink 6 to 8 cups of water per day, monitor for lower extremity/leg swelling.  Please follow-up with your primary care physician and nurse practitioner, Jeanett Schlein.  For your memory, I recommend you continue with generic Aricept 10 mg daily.

## 2018-11-07 NOTE — Progress Notes (Signed)
Subjective:    Patient ID: Katherine Frye is a 73 y.o. female.  HPI     [name], MD, PhD [facility] [address], [address] [place], [place] [address]  Dear Gari Crown,  I saw Katherine Frye, at your and Dr. Cathren Laine request for second opinion regarding her parkinsonism.  The patient is accompanied by her Katherine Frye Katherine Frye, and 73 yo Katherine Frye today.  As you know, Katherine Frye is a 73 year old right-handed woman with an underlying medical history of breast cancer, allergies, obesity, and memory loss, who was previously evaluated by Dr. [name] for memory loss in 2016.  She subsequently saw a neurologist at another facility, Dr. [name] and had diagnostic testing including EEG.  She also had a subsequent DaTscan in April 2018 which was abnormal.  She was started on Azilect for parkinsonism and sometime in mid 2018 she was started on Sinemet generic.  She then stopped taking the Sinemet.  She subsequently saw Dr. Carles Collet at [facility] neurology, last visit in October 2019.  She had stopped taking her Sinemet IR sometime in 2018 and was restarted on Sinemet CR in February 2019 by Dr. Carles Collet.  The patient has had ongoing issues with memory loss and also depression. Another neurological opinion has been requested for her progressive decline in motor function. She is currently on Sinemet CR 25-100 mg strength 1 pill twice daily at 8 AM and 12 noon, and Sinemet CR 50-200 mg strength at night.  Katherine Frye is not sure if she is on Rytary. She had a brain MRI without contrast on 06/13/2014 and I reviewed the results: IMPRESSION:  Abnormal MRI brain (without) demonstrating:  1. Mild perisylvian atrophy.  2. Few scattered periventricular and subcortical foci of  non-specific gliosis.  3. No acute findings.  Her Parkinson symptoms date back to 2016 when she started having the left hand tremor.  She also had complaints of memory issues, particularly word finding difficulties.  She has over the  course of time developed a more consistent tremor on the left side and then also on the right side.  She has had ongoing issues with low back pain.  She had received low back injections under Dr. Letta Pate in the past.  She has not fallen recently.  She lives with her husband.  They have 2 grown daughters, Katherine Frye and Katherine Frye.  Her husband is 10 years old.  She has had a progressive decline in her memory function as well as her motor function.  She has ongoing issues with diarrhea, typically no constipation.  She is plugged in with [facility] and has joined the daytime program 5 days out of the week, she gets picked up between 9 and 10 AM and dropped off around 5 PM.  She has physical therapy twice a week, occupational therapy twice a week, and just started speech therapy as well.  She drinks quite a bit of liquids, mostly in the form of Gatorade, more than 8 cups/day.  She has had lower extremity swelling.  She has been on donepezil.  She was on another medication for memory per Katherine Frye, this was discontinued because of low blood pressure.  Her Past Medical History Is Significant For: Past Medical History:  Diagnosis Date  . Allergy 2010   medication and seasonal  . Cancer ([place]) 2010   right breast  . Malignant neoplasm of upper-inner quadrant of female breast ([place]) 2010  . Obesity, unspecified 2013  . Solitary cyst of breast  2012   left  . Special screening for malignant neoplasms, colon 2013    Her Past Surgical History Is Significant For: Past Surgical History:  Procedure Laterality Date  . APPENDECTOMY    . BREAST BIOPSY Right 2010  . BREAST CYST EXCISION Right 2006   fibroid cyst  . BREAST SURGERY Right 2010   T2, N0, ER/PR positive, HER-2/neu negative, Oncotype recurrence score  of 10 ( 7% risk of recurrence)   . COLONOSCOPY  2010   Dr. Lyndel Safe, [place]  . FOOT SURGERY  1996  . KNEE ARTHROSCOPY Right 2012   May and October  . KNEE SURGERY Right 2013   replacement  . NASAL  SINUS SURGERY  1991  . SKIN CANCER EXCISION  2013   basel cell on forehead  . TONSILLECTOMY    . TUBAL LIGATION    . UPPER GI ENDOSCOPY  2010    Her Family History Is Significant For: Family History  Problem Relation Age of Onset  . Lymphoma Sister        non-hodgkin  . Cancer Sister        cancer of thyroid  . Heart disease Mother   . Heart disease Father     Her Social History Is Significant For: Social History   Socioeconomic History  . Marital status: Married    Spouse name: Marieclaire Bettenhausen "Vic"  . Number of children: 2  . Years of education: 33  . Highest education level: Not on file  Occupational History  . Not on file  Social Needs  . Financial resource strain: Not on file  . Food insecurity    Worry: Not on file    Inability: Not on file  . Transportation needs    Medical: Not on file    Non-medical: Not on file  Tobacco Use  . Smoking status: Never Smoker  . Smokeless tobacco: Never Used  Substance and Sexual Activity  . Alcohol use: No  . Drug use: No  . Sexual activity: Not on file  Lifestyle  . Physical activity    Days per week: Not on file    Minutes per session: Not on file  . Stress: Not on file  Relationships  . Social Herbalist on phone: Not on file    Gets together: Not on file    Attends religious service: Not on file    Active member of club or organization: Not on file    Attends meetings of clubs or organizations: Not on file    Relationship status: Not on file  Other Topics Concern  . Not on file  Social History Narrative   Lives at home with husband.   Caffeine use: Drinks 1 cup coffee per day (sometimes 2 cups per day)    Her Allergies Are:  Allergies  Allergen Reactions  . Levaquin [Levofloxacin In D5w] Nausea And Vomiting    Also dizziness and headaches  :   Her Current Medications Are:  Outpatient Encounter Medications as of 11/07/2018  Medication Sig  . BIOTIN PO Take by mouth daily.  . Calcium  Carb-Cholecalciferol (CALCIUM-VITAMIN D3) 600-400 MG-UNIT CAPS Take 1 tablet by mouth daily. '1200mg'$  Ca and 1000units of vit D: For bone health  . carbidopa-levodopa (SINEMET CR) 50-200 MG tablet Take 1 tablet by mouth at bedtime.  . Carbidopa-Levodopa ER (SINEMET CR) 25-100 MG tablet controlled release Take 1 tablet by mouth 3 (three) times daily.  Marland Kitchen dicyclomine (BENTYL) 10 MG capsule Take 1  capsule (10 mg total) by mouth 2 (two) times daily.  Marland Kitchen donepezil (ARICEPT) 10 MG tablet Take 1 tablet (10 mg total) by mouth at bedtime.  . DULoxetine (CYMBALTA) 30 MG capsule Take 30 mg by mouth daily. In am  . DULoxetine (CYMBALTA) 60 MG capsule Take 1 capsule (60 mg total) by mouth at bedtime. For depression  . ibuprofen (ADVIL,MOTRIN) 200 MG tablet Take 200 mg by mouth 3 (three) times daily.   . Multiple Vitamin (MULTIVITAMIN WITH MINERALS) TABS tablet Take 1 tablet by mouth at bedtime. For low Vitamin  . Multiple Vitamins-Minerals (ICAPS MV PO) Take by mouth daily.  . vitamin B-12 (CYANOCOBALAMIN) 1000 MCG tablet Take 1 tablet (1,000 mcg total) by mouth daily. For B-12 deficiency  . Vitamin D, Cholecalciferol, 1000 units CAPS Take 7,000 Units by mouth daily. For bone health   No facility-administered encounter medications on file as of 11/07/2018.   :   Review of Systems:  Out of a complete 14 point review of systems, all are reviewed and negative with the exception of these symptoms as listed below: Review of Systems  Neurological:       Here for second opinion on parkinson's/dementia; tremors are present taking carbidopa levodopa CR 25-100 1 tid and 50-200 mg at night     Objective:  Neurological Exam  Physical Exam Physical Examination:   Vitals:   11/07/18 1302  BP: 136/79  Pulse: (!) 50  Temp: (!) 97.3 F (36.3 C)    General Examination: The patient is a very pleasant 73 y.o. female in no acute distress. She appears Deconditioned, she is well-groomed.  She is able to provide some of  her history, her Katherine Frye supplements her history with more details.  HEENT: Normocephalic, atraumatic, pupils are equal, round and reactive to light, Extraocular tracking is impaired with saccadic breakdown, face is moderately Mast, she has moderate nuchal rigidity, she has moderate hypophonia, no significant dysarthria.  Hearing is grossly intact.  She has decreased eye blink rate.  Airway examination reveals no significant sialorrhea, tongue protrudes centrally in palate elevates symmetrically.  No carotid bruits.   Chest: Clear to auscultation without wheezing, rhonchi or crackles noted.  Heart: S1+S2+0, regular and normal without murmurs, rubs or gallops noted.   Abdomen: Soft, non-tender and non-distended with normal bowel sounds appreciated on auscultation.  Extremities: There is 2+ pitting edema in the distal lower extremities bilaterally.   Skin: Warm and dry without trophic changes noted.  Musculoskeletal: exam reveals no obvious joint deformities, tenderness or joint swelling or erythema.   Neurologically:  Mental status: The patient is awake, alert and oriented in all 4 spheres. Her immediate and remote memory, attention, language skills and fund of knowledge are Impaired.  Mood is normal, affect mildly blunted. On 11/07/2018: MMSE: 21/30. Cranial nerves II - XII are as described above under HEENT exam. In addition: shoulder shrug is normal with equal shoulder height noted. Motor exam: Normal bulk, Global strength of 4 out of 5, increased tone throughout with cogwheeling in both upper extremities, left side more pronounced.  She has a moderate degree of continual resting tremor in the left upper extremity, mild to moderate and intermittent resting tremor in the right upper extremity, mild to moderate resting tremor in the left lower extremity, no resting tremor in the right lower extremity.  She has overall moderate bradykinesia.  Fine motor testing with finger taps, hand movements,  rapid alternating padding shows severe difficulty with the left upper extremity, moderate to  severe difficulty with the right upper extremity, foot taps and foot agility are moderately impaired on the left and mild to moderate on the right.  She stands up with difficulty and requires several attempts.  She has a moderately stooped posture.  She did not bring a walker or cane or any other walking aid.  She walks with decreased stride length and pace, near absence of arm swing on the left and decreased arm swing on the right, turns in 3 steps and is slightly unsteady with turns.  Her balance is mildly impaired.  Reflexes are 1-2+ in the upper extremities, trace in the knees and absent in the ankles.  Cerebellar testing shows no dysmetria or intention tremor.  Sensory exam: intact to light touch in the upper and lower extremities.   Assessment and Plan:   In summary, ADENIKE SHIDLER is a very pleasant 73 y.o.-year old female with an underlying medical history of breast cancer, allergies, obesity, and memory loss, who Presents for another opinion regarding her Parkinson's disease.  Her history and examination are that her predominant Parkinson's disease, advanced, associated with Parkinson's associated With dementia and likely also with some autonomic dysfunction, reportedly low blood pressure values in the past.  Her blood pressure currently is stable.  She has a low heart rate however.  She has no significant constipation, in fact, she has been battling diarrhea.  She is discouraged from over hydrating especially over utilizing electrolyte water.  She is advised to drink water and limit herself to 6 to 8 cups/day.  She is advised to continue with Sinemet long-acting.  She is encouraged to try to use it 3 times a day, namely at 8, 12 and 4 PM and continue with the CR 50-200 mg strength at bedtime around 11 PM.  They are encouraged to look into more long-term living situation such as assisted living.  She is  showing some gait instability and balance issue, I would recommend that she use her walker at all times.We talked about the importance of fall prevention.  Unfortunately, advanced Parkinson's disease comes with significant challenges and sometimes medication intolerances.  It is difficult Sometimes to find the right balance between the medication and its potential side effects.  Unfortunately, there is not a whole lot I can add at this time.  She does appear to have had a faster progression but no telltale evidence for atypical parkinsonism at this time. She has most likely idiopathic Parkinson's disease with left-sided predominance. She is advised to follow-up with [facility] on a regular basis.  I answered all their questions today and the patient and her Katherine Frye, Olegario Shearer, were in agreement.   Thank you very much for allowing me to participate in the care of this nice patient. If I can be of any further assistance to you please do not hesitate to call me at [phone].  Sincerely,   [name], MD, PhD

## 2022-11-11 ENCOUNTER — Other Ambulatory Visit (HOSPITAL_COMMUNITY): Payer: Self-pay | Admitting: Neurology

## 2022-11-11 DIAGNOSIS — F0390 Unspecified dementia without behavioral disturbance: Secondary | ICD-10-CM

## 2022-11-11 DIAGNOSIS — G959 Disease of spinal cord, unspecified: Secondary | ICD-10-CM

## 2022-11-11 DIAGNOSIS — R531 Weakness: Secondary | ICD-10-CM

## 2022-11-24 ENCOUNTER — Ambulatory Visit (HOSPITAL_COMMUNITY)
Admission: RE | Admit: 2022-11-24 | Discharge: 2022-11-24 | Disposition: A | Discharge status: Home / Self Care | Payer: Medicare Other | Source: Ambulatory Visit | Attending: Neurology | Admitting: Neurology

## 2022-11-24 DIAGNOSIS — F0390 Unspecified dementia without behavioral disturbance: Secondary | ICD-10-CM | POA: Diagnosis present

## 2022-11-24 DIAGNOSIS — G959 Disease of spinal cord, unspecified: Secondary | ICD-10-CM | POA: Diagnosis present

## 2022-11-24 DIAGNOSIS — R531 Weakness: Secondary | ICD-10-CM | POA: Diagnosis present

## 2022-11-25 ENCOUNTER — Ambulatory Visit (HOSPITAL_COMMUNITY): Payer: Medicare Other

## 2023-04-06 DEATH — deceased
